# Patient Record
Sex: Female | Born: 1955
Health system: Southern US, Community
[De-identification: ages and names within clinical notes are randomized; demographics above are authoritative.]

## PROBLEM LIST (undated history)

## (undated) DIAGNOSIS — I499 Cardiac arrhythmia, unspecified: Secondary | ICD-10-CM

## (undated) DIAGNOSIS — I10 Essential (primary) hypertension: Secondary | ICD-10-CM

## (undated) HISTORY — PX: BREAST SURGERY: SHX581

## (undated) HISTORY — PX: KNEE SURGERY: SHX244

---

## 2005-06-05 ENCOUNTER — Ambulatory Visit (HOSPITAL_COMMUNITY): Admission: RE | Admit: 2005-06-05 | Discharge: 2005-06-05 | Payer: Self-pay | Admitting: Family Medicine

## 2005-06-05 ENCOUNTER — Emergency Department (HOSPITAL_COMMUNITY): Admission: EM | Admit: 2005-06-05 | Discharge: 2005-06-05 | Payer: Self-pay | Admitting: Family Medicine

## 2007-11-01 ENCOUNTER — Emergency Department (HOSPITAL_COMMUNITY): Admission: EM | Admit: 2007-11-01 | Discharge: 2007-11-01 | Payer: Self-pay | Admitting: Family Medicine

## 2009-02-05 ENCOUNTER — Emergency Department (HOSPITAL_COMMUNITY): Admission: EM | Admit: 2009-02-05 | Discharge: 2009-02-05 | Payer: Self-pay | Admitting: Family Medicine

## 2009-02-05 ENCOUNTER — Ambulatory Visit: Payer: Self-pay | Admitting: Vascular Surgery

## 2009-02-05 ENCOUNTER — Ambulatory Visit: Admission: RE | Admit: 2009-02-05 | Discharge: 2009-02-05 | Payer: Self-pay | Admitting: Internal Medicine

## 2009-02-05 ENCOUNTER — Encounter (INDEPENDENT_AMBULATORY_CARE_PROVIDER_SITE_OTHER): Payer: Self-pay | Admitting: Internal Medicine

## 2009-11-25 ENCOUNTER — Emergency Department (HOSPITAL_COMMUNITY): Admission: EM | Admit: 2009-11-25 | Discharge: 2009-11-25 | Payer: Self-pay | Admitting: Emergency Medicine

## 2010-06-15 ENCOUNTER — Emergency Department (HOSPITAL_COMMUNITY): Admission: EM | Admit: 2010-06-15 | Discharge: 2010-06-15 | Payer: Self-pay | Admitting: Emergency Medicine

## 2010-11-22 LAB — POCT CARDIAC MARKERS
CKMB, poc: 1.4 ng/mL (ref 1.0–8.0)
Myoglobin, poc: 114 ng/mL (ref 12–200)
Troponin i, poc: <0.05 ng/mL (ref 0.00–0.09)

## 2010-11-22 LAB — POCT I-STAT, CHEM 8
BUN: 6 mg/dL (ref 6–23)
Calcium, Ion: 1.04 mmol/L — ABNORMAL LOW (ref 1.12–1.32)
Chloride: 106 mEq/L (ref 96–112)
Creatinine, Ser: 0.6 mg/dL (ref 0.4–1.2)
Glucose, Bld: 98 mg/dL (ref 70–99)
HCT: 43 % (ref 36.0–46.0)
Hemoglobin: 14.6 g/dL (ref 12.0–15.0)
Potassium: 3.3 mEq/L — ABNORMAL LOW (ref 3.5–5.1)
Sodium: 138 mEq/L (ref 135–145)
TCO2: 22 mmol/L (ref 0–100)

## 2010-12-07 LAB — POCT I-STAT, CHEM 8
BUN: 10 mg/dL (ref 6–23)
Calcium, Ion: 1.14 mmol/L (ref 1.12–1.32)
Chloride: 104 mEq/L (ref 96–112)
Creatinine, Ser: 0.7 mg/dL (ref 0.4–1.2)
Glucose, Bld: 89 mg/dL (ref 70–99)
HCT: 44 % (ref 36.0–46.0)
Hemoglobin: 15 g/dL (ref 12.0–15.0)
Potassium: 4.2 mEq/L (ref 3.5–5.1)
Sodium: 140 mEq/L (ref 135–145)
TCO2: 28 mmol/L (ref 0–100)

## 2011-05-24 LAB — I-STAT 8, (EC8 V) (CONVERTED LAB)
Acid-base deficit: 3 — ABNORMAL HIGH
BUN: 7
Bicarbonate: 23.7
Chloride: 107
Glucose, Bld: 106 — ABNORMAL HIGH
HCT: 47 — ABNORMAL HIGH
Hemoglobin: 16 — ABNORMAL HIGH
Operator id: 146091
Potassium: 3.8
Sodium: 137
TCO2: 25
pCO2, Ven: 48.3
pH, Ven: 7.299

## 2011-05-24 LAB — POCT CARDIAC MARKERS
CKMB, poc: 1.3
CKMB, poc: 1.4
CKMB, poc: <1 — ABNORMAL LOW
Myoglobin, poc: 53.4
Myoglobin, poc: 54.9
Myoglobin, poc: 55.1
Operator id: 146091
Operator id: 146091
Operator id: 146091
Troponin i, poc: <0.05
Troponin i, poc: <0.05
Troponin i, poc: <0.05

## 2011-05-24 LAB — URINE CULTURE: Colony Count: 7000

## 2011-05-24 LAB — DIFFERENTIAL
Basophils Absolute: 0.1
Basophils Relative: 1
Eosinophils Absolute: 0.2
Eosinophils Relative: 2
Lymphocytes Relative: 24
Lymphs Abs: 2.9
Monocytes Absolute: 0.3
Monocytes Relative: 2 — ABNORMAL LOW
Neutro Abs: 8.7 — ABNORMAL HIGH
Neutrophils Relative %: 71

## 2011-05-24 LAB — URINE MICROSCOPIC-ADD ON

## 2011-05-24 LAB — URINALYSIS, ROUTINE W REFLEX MICROSCOPIC
Bilirubin Urine: NEGATIVE
Glucose, UA: NEGATIVE
Ketones, ur: NEGATIVE
Leukocytes, UA: NEGATIVE
Nitrite: NEGATIVE
Protein, ur: NEGATIVE
Specific Gravity, Urine: 1.01
Urobilinogen, UA: 1
pH: 6.5

## 2011-05-24 LAB — POCT I-STAT CREATININE
Creatinine, Ser: 0.7
Operator id: 146091

## 2011-05-24 LAB — CBC
HCT: 41.8
Hemoglobin: 13.6
MCHC: 32.6
MCV: 81.2
Platelets: 374
RBC: 5.15 — ABNORMAL HIGH
RDW: 15.2
WBC: 12.2 — ABNORMAL HIGH

## 2011-07-30 ENCOUNTER — Emergency Department (INDEPENDENT_AMBULATORY_CARE_PROVIDER_SITE_OTHER)
Admission: EM | Admit: 2011-07-30 | Discharge: 2011-07-30 | Disposition: A | Discharge status: Home / Self Care | Payer: 59 | Source: Home / Self Care | Attending: Family Medicine | Admitting: Family Medicine

## 2011-07-30 ENCOUNTER — Ambulatory Visit (HOSPITAL_COMMUNITY)
Admit: 2011-07-30 | Discharge: 2011-07-30 | Disposition: A | Discharge status: Home / Self Care | Payer: 59 | Attending: Family Medicine | Admitting: Family Medicine

## 2011-07-30 DIAGNOSIS — M79609 Pain in unspecified limb: Secondary | ICD-10-CM

## 2011-07-30 DIAGNOSIS — M7989 Other specified soft tissue disorders: Secondary | ICD-10-CM

## 2011-07-30 DIAGNOSIS — M6790 Unspecified disorder of synovium and tendon, unspecified site: Secondary | ICD-10-CM

## 2011-07-30 HISTORY — DX: Cardiac arrhythmia, unspecified: I49.9

## 2011-07-30 HISTORY — DX: Essential (primary) hypertension: I10

## 2011-07-30 MED ORDER — HYDROCODONE-ACETAMINOPHEN 5-325 MG PO TABS
ORAL_TABLET | ORAL | Status: AC
Start: 2011-07-30 — End: 2011-08-09

## 2011-07-30 MED ORDER — PREDNISONE (PAK) 10 MG PO TABS: ORAL_TABLET | ORAL | Status: AC

## 2011-07-30 NOTE — ED Notes (Signed)
Area distal to rt antecubital feels swollen, firm and tender to palpation.  No increased warmth or redness noted.

## 2011-07-30 NOTE — Progress Notes (Signed)
Right upper extremity venous duplex completed at 11:05.  Preliminary report is negative for DVT or SVT. Smiley Houseman 07/30/2011, 12:08 PM

## 2011-07-30 NOTE — ED Notes (Signed)
Pt states she has hx of HTN but stopped medication long time ago because it made her head hurt.  States she has not slept well due to pain in rt arm and she also had stressful day yesterday.

## 2011-07-30 NOTE — ED Provider Notes (Signed)
History     CSN: [id number] Arrival date & time: 07/30/2011  9:27 AM   First MD Initiated Contact with Patient 07/30/11 1006      Chief Complaint  Patient presents with  . Arm Pain    (Consider location/radiation/quality/duration/timing/severity/associated sxs/prior treatment) HPI Comments: Sheila Webster presents for evaluation of 2 week hx of RIGHT upper extremity. She denies any injury. She reports hx of HTN, uncontrolled and has not seen a provider in an unknown amount of time. She also reports hx of tobacco abuse, and currently smokes. She reports constant pain, worsened with movement, or laying on that side.   Patient is a 55 y.o. female presenting with arm pain. The history is provided by the patient.  Arm Pain This is a new problem. The current episode started more than 1 week ago. The problem occurs constantly. The problem has not changed since onset.Pertinent negatives include no chest pain and no shortness of breath. The symptoms are aggravated by bending and exertion. The symptoms are relieved by nothing. She has tried a warm compress for the symptoms. The treatment provided no relief.    Past Medical History  Diagnosis Date  . Irregular heartbeat   . Hypertension     Past Surgical History  Procedure Date  . Breast surgery     No family history on file.  History  Substance Use Topics  . Smoking status: Current Everyday Smoker -- 0.5 packs/day  . Smokeless tobacco: Not on file  . Alcohol Use: No    OB History    Grav Para Term Preterm Abortions TAB SAB Ect Mult Living                  Review of Systems  Constitutional: Negative.   HENT: Negative.   Eyes: Negative.   Respiratory: Negative.  Negative for shortness of breath.   Cardiovascular: Negative for chest pain.  Gastrointestinal: Negative.   Genitourinary: Negative.   Musculoskeletal: Negative.   Skin: Negative.   Neurological: Negative.     Allergies  Review of patient's allergies indicates no  known allergies.  Home Medications   Current Outpatient Rx  Name Route Sig Dispense Refill  . TYLENOL PO Oral Take by mouth as needed.        BP 148/110  Pulse 84  Temp(Src) 98.3 F (36.8 C) (Oral)  Resp 16  SpO2 100%  Physical Exam  Nursing note and vitals reviewed. Constitutional: She is oriented to person, place, and time. She appears well-developed and well-nourished.  HENT:  Head: Normocephalic and atraumatic.  Eyes: EOM are normal.  Neck: Normal range of motion.  Musculoskeletal:       Right elbow: She exhibits swelling. She exhibits normal range of motion. tenderness found. No radial head, no medial epicondyle, no lateral epicondyle and no olecranon process tenderness noted.       Arms: Neurological: She is alert and oriented to person, place, and time.  Skin: Skin is warm and dry.    ED Course  Procedures (including critical care time)  Labs Reviewed - No data to display No results found.   No diagnosis found.    MDM  Venous doppler upper extremity: negative for clot or obstruction        Richardo Priest, MD 07/30/11 1243

## 2011-07-30 NOTE — ED Notes (Signed)
Pt to vascular lab for Venous doppler rt arm via [facility] shuttle.

## 2011-07-30 NOTE — ED Notes (Signed)
C/o pain and swelling to rt arm for 2 weeks.  States unable to sleep due to pain.  Denies injury or fall.

## 2011-07-30 NOTE — ED Notes (Signed)
Dr Juanetta Gosling notified of pt's BP and hx

## 2012-05-29 ENCOUNTER — Emergency Department (INDEPENDENT_AMBULATORY_CARE_PROVIDER_SITE_OTHER)
Admission: EM | Admit: 2012-05-29 | Discharge: 2012-05-29 | Disposition: A | Discharge status: Home / Self Care | Payer: Commercial Managed Care - PPO | Source: Home / Self Care | Attending: Family Medicine | Admitting: Family Medicine

## 2012-05-29 ENCOUNTER — Encounter (HOSPITAL_COMMUNITY): Payer: Self-pay | Admitting: *Deleted

## 2012-05-29 DIAGNOSIS — H5710 Ocular pain, unspecified eye: Secondary | ICD-10-CM

## 2012-05-29 DIAGNOSIS — S0502XA Injury of conjunctiva and corneal abrasion without foreign body, left eye, initial encounter: Secondary | ICD-10-CM

## 2012-05-29 MED ORDER — POLYMYXIN B-TRIMETHOPRIM 10000-0.1 UNIT/ML-% OP SOLN: 1.0000 [drp] | Freq: Four times a day (QID) | OPHTHALMIC | Status: DC

## 2012-05-29 MED ORDER — TETRACAINE HCL 0.5 % OP SOLN
OPHTHALMIC | Status: AC
  Filled 2012-05-29: qty 2

## 2012-05-29 MED ORDER — TETRACAINE HCL 0.5 % OP SOLN
1.0000 [drp] | Freq: Once | OPHTHALMIC | Status: AC
  Administered 2012-05-29: 1 [drp] via OPHTHALMIC

## 2012-05-29 NOTE — ED Provider Notes (Signed)
Medical screening examination/treatment/procedure(s) were performed by resident physician or non-physician practitioner and as supervising physician I was immediately available for consultation/collaboration.   Barkley Bruns MD.    Linna Hoff, MD 05/29/12 2123

## 2012-05-29 NOTE — ED Notes (Signed)
Pt reports she woke up this morning with redness, photosensitivity  and burning of the  left eye.  She denies any recent trauma

## 2012-05-29 NOTE — ED Provider Notes (Signed)
History     CSN: [id number]  Arrival date & time 05/29/12  1842   First MD Initiated Contact with Patient 05/29/12 2029      Chief Complaint  Patient presents with  . Eye Pain    (Consider location/radiation/quality/duration/timing/severity/associated sxs/prior treatment) Patient is a 56 y.o. female presenting with eye pain. The history is provided by the patient.  Eye Pain  Patient reports left eye pain for one day associated with redness and photophobia.  Denies known foreign body.  Denies previous history of same.  Not diabetic, no previous eye surgery/trauma, denies cataracts or glaucoma. No floaters or flashing lights No vision loss + blurred vision + eye pain No eyelid itching No tearing No headache/scalp tenderness Does not wear contacts but wears glasses. Works at [employer], has made an appointment with Dr. Mitzi Davenport for eye evaluation  Past Medical History  Diagnosis Date  . Irregular heartbeat   . Hypertension     Past Surgical History  Procedure Date  . Breast surgery     Family History  Problem Relation Age of Onset  . Cancer Mother     History  Substance Use Topics  . Smoking status: Current Every Day Smoker -- 0.5 packs/day  . Smokeless tobacco: Not on file  . Alcohol Use: No    OB History    Grav Para Term Preterm Abortions TAB SAB Ect Mult Living                  Review of Systems  Constitutional: Negative.   Eyes: Positive for photophobia, pain, discharge, redness and visual disturbance. Negative for itching.  Respiratory: Negative.   Neurological: Negative.     Allergies  Review of patient's allergies indicates no known allergies.  Home Medications   Current Outpatient Rx  Name Route Sig Dispense Refill  . TYLENOL PO Oral Take by mouth as needed.      Marland Kitchen POLYMYXIN B-TRIMETHOPRIM 10000-0.1 UNIT/ML-% OP SOLN Left Eye Place 1 drop into the left eye 4 (four) times daily. For five days 10 mL 0    BP 122/86  Pulse 94  Temp  98.5 F (36.9 C) (Oral)  Resp 18  SpO2 100%  Physical Exam  Nursing note and vitals reviewed. Constitutional: She is oriented to person, place, and time. Vital signs are normal. She appears well-developed and well-nourished. She is active and cooperative.  HENT:  Head: Normocephalic and atraumatic.  Right Ear: Hearing, tympanic membrane, external ear and ear canal normal.  Left Ear: Hearing, tympanic membrane, external ear and ear canal normal.  Nose: Nose normal.  Mouth/Throat: Uvula is midline, oropharynx is clear and moist and mucous membranes are normal.  Eyes: EOM and lids are normal. Pupils are equal, round, and reactive to light. No foreign bodies found. Right eye exhibits no chemosis, no discharge, no exudate and no hordeolum. No foreign body present in the right eye. Left eye exhibits no discharge, no exudate and no hordeolum. No foreign body present in the left eye. Right conjunctiva is not injected. Right conjunctiva has no hemorrhage. Left conjunctiva is injected. Left conjunctiva has no hemorrhage. No scleral icterus.         Tetracaine and fluoroscien applied to left eye, examined with woods lamp.  Left corneal abrasion noted at 2 oclock, no foreign body found.  Pt tolerated procedure well  Neck: Trachea normal. Neck supple.  Cardiovascular: Normal rate and regular rhythm.   Pulmonary/Chest: Effort normal and breath sounds normal.  Neurological: She is alert  and oriented to person, place, and time. No cranial nerve deficit or sensory deficit.  Skin: Skin is warm and dry.  Psychiatric: She has a normal mood and affect. Her speech is normal and behavior is normal. Judgment and thought content normal. Cognition and memory are normal.    ED Course  Procedures (including critical care time)  Labs Reviewed - No data to display No results found.   1. Left corneal abrasion   2. Eye pain       MDM  Antibiotics as prescribed.  Ibuprofen as needed for pain.  Seek follow-up  care with an ophthalmologist within 2-3 days to ensure resolution.        Johnsie Kindred, NP 05/29/12 2053

## 2014-12-24 ENCOUNTER — Encounter (HOSPITAL_COMMUNITY): Payer: Self-pay

## 2014-12-24 ENCOUNTER — Emergency Department (HOSPITAL_COMMUNITY): Payer: Commercial Managed Care - PPO

## 2014-12-24 ENCOUNTER — Emergency Department (HOSPITAL_COMMUNITY)
Admission: EM | Admit: 2014-12-24 | Discharge: 2014-12-24 | Disposition: A | Discharge status: Home / Self Care | Payer: Commercial Managed Care - PPO | Attending: Emergency Medicine | Admitting: Emergency Medicine

## 2014-12-24 DIAGNOSIS — M1712 Unilateral primary osteoarthritis, left knee: Secondary | ICD-10-CM | POA: Insufficient documentation

## 2014-12-24 DIAGNOSIS — R609 Edema, unspecified: Secondary | ICD-10-CM

## 2014-12-24 DIAGNOSIS — Z791 Long term (current) use of non-steroidal anti-inflammatories (NSAID): Secondary | ICD-10-CM | POA: Insufficient documentation

## 2014-12-24 DIAGNOSIS — M25462 Effusion, left knee: Secondary | ICD-10-CM

## 2014-12-24 DIAGNOSIS — Z720 Tobacco use: Secondary | ICD-10-CM | POA: Insufficient documentation

## 2014-12-24 DIAGNOSIS — I10 Essential (primary) hypertension: Secondary | ICD-10-CM | POA: Insufficient documentation

## 2014-12-24 DIAGNOSIS — Z9889 Other specified postprocedural states: Secondary | ICD-10-CM | POA: Insufficient documentation

## 2014-12-24 MED ORDER — IBUPROFEN 800 MG PO TABS: 800.0000 mg | ORAL_TABLET | Freq: Three times a day (TID) | ORAL | Status: DC

## 2014-12-24 NOTE — ED Provider Notes (Signed)
CSN: [id number]     Arrival date & time 12/24/14  1711 History  This chart was scribed for non-physician practitioner, Sheila Skeenobyn Kaled Allende, PA-C, working with Mancel BaleElliott Wentz, MD by Charline BillsEssence Howell, ED Scribe. This patient was seen in room WTR6/WTR6 and the patient's care was started at 5:38 PM.   Chief Complaint  Patient presents with  . Knee Pain   The history is provided by the patient. No language interpreter was used.   HPI Comments: Sheila Webster is a 59 y.o. female, with a h/o HTN, who presents to the Emergency Department complaining of persistent L knee pain for the past 2 weeks. Pt first noticed the pain while walking. She denies recent injury or previous injury. She reports 8/10 pain that is exacerbated with walking. Pt also reports associate swelling and bruising to the area. She has been treating with 2 Aleve tablets and 800 mg ibuprofen with temporary relief.  Past Medical History  Diagnosis Date  . Irregular heartbeat   . Hypertension    Past Surgical History  Procedure Laterality Date  . Breast surgery    . Knee surgery Right    Family History  Problem Relation Age of Onset  . Cancer Mother    History  Substance Use Topics  . Smoking status: Current Every Day Smoker -- 0.50 packs/day    Types: Cigarettes  . Smokeless tobacco: Not on file  . Alcohol Use: No   OB History    No data available     Review of Systems  Constitutional: Negative.   Respiratory: Negative.   Cardiovascular: Negative.   Musculoskeletal: Positive for joint swelling and arthralgias.  Neurological: Negative.    Allergies  Review of patient's allergies indicates no known allergies.  Home Medications   Prior to Admission medications   Medication Sig Start Date End Date Taking? Authorizing Provider  ibuprofen (ADVIL,MOTRIN) 200 MG tablet Take 200-800 mg by mouth every 6 (six) hours as needed for moderate pain.   Yes Historical Provider, MD  naproxen sodium (ANAPROX) 220 MG tablet Take 220 mg by  mouth 2 (two) times daily with a meal.   Yes Historical Provider, MD  ibuprofen (ADVIL,MOTRIN) 800 MG tablet Take 1 tablet (800 mg total) by mouth 3 (three) times daily. 12/24/14   Kathrynn Speedobyn M Dinero Chavira, PA-C  trimethoprim-polymyxin b (POLYTRIM) ophthalmic solution Place 1 drop into the left eye 4 (four) times daily. For five days Patient not taking: Reported on 12/24/2014 05/29/12   Johnsie Kindredarmen L Chatten, NP   BP 132/85 mmHg  Pulse 90  Temp(Src) 99.3 F (37.4 C) (Oral)  Resp 16  SpO2 99% Physical Exam  Constitutional: She is oriented to person, place, and time. She appears well-developed and well-nourished. No distress.  HENT:  Head: Normocephalic and atraumatic.  Mouth/Throat: Oropharynx is clear and moist.  Eyes: Conjunctivae and EOM are normal.  Neck: Normal range of motion. Neck supple.  Cardiovascular: Normal rate, regular rhythm and normal heart sounds.   Pulmonary/Chest: Effort normal and breath sounds normal. No respiratory distress.  Musculoskeletal: Normal range of motion.  Left knee TTP medial to patella and over medial jointline with mild swelling. No erythema or warmth. Full range of motion. No ligamentous laxity. Crepitus noted.  Neurological: She is alert and oriented to person, place, and time. No sensory deficit.  Skin: Skin is warm and dry.  Psychiatric: She has a normal mood and affect. Her behavior is normal.  Nursing note and vitals reviewed.  ED Course  Procedures (including critical  care time) DIAGNOSTIC STUDIES: Oxygen Saturation is 99% on RA, normal by my interpretation.    COORDINATION OF CARE: 5:40 PM-Discussed treatment plan which includes XR with pt at bedside and pt agreed to plan.   Labs Review Labs Reviewed - No data to display  Imaging Review Dg Knee Complete 4 Views Left  12/24/2014   CLINICAL DATA:  Medial left knee pain  EXAM: LEFT KNEE - COMPLETE 4+ VIEW  COMPARISON:  None.  FINDINGS: Small suprapatellar joint effusion. Patellofemoral, medial compartment  narrowing is noted. There is tricompartment marginal spur formation in sharpening of the tibial spines. No fracture or dislocation. There is no evidence of arthropathy or other focal bone abnormality. Soft tissues are unremarkable.  IMPRESSION: 1. Small joint effusion. 2. Tricompartment osteoarthritis.   Electronically Signed   By: Signa Kell M.D.   On: 12/24/2014 19:02     EKG Interpretation None      MDM   Final diagnoses:  Osteoarthritis of left knee, unspecified osteoarthritis type  Knee effusion, left   NAD. VSS. No erythema or warmth concerning for infection or gout. X-ray with results as shown above. Advised continued NSAIDs, RICE. Resources given for PCP establishment, along with orthopedic follow-up. Stable for discharge. Return precautions given. Patient states understanding of treatment care plan and is agreeable.  I personally performed the services described in this documentation, which was scribed in my presence. The recorded information has been reviewed and is accurate.  Kathrynn Speed, PA-C 12/24/14 1921  Mancel Bale, MD 12/26/14 [phone]

## 2014-12-24 NOTE — ED Notes (Signed)
Pt c/o increasing L knee pain x 1 week and swelling starting this morning. Pain score 8/10.  Denies injury.  Sts "it kinda locked up when I was trying to get out of bed this morning."  Pt reports taking OTC pain medication w/ a little relief.  Pt was able to ambulate into Triage room w/o assistance.

## 2014-12-24 NOTE — Discharge Instructions (Signed)
Take ibuprofen as directed for pain and swelling. Rest, ice and elevate your knee.  Osteoarthritis Osteoarthritis is a disease that causes soreness and inflammation of a joint. It occurs when the cartilage at the affected joint wears down. Cartilage acts as a cushion, covering the ends of bones where they meet to form a joint. Osteoarthritis is the most common form of arthritis. It often occurs in older people. The joints affected most often by this condition include those in the:  Ends of the fingers.  Thumbs.  Neck.  Lower back.  Knees.  Hips. CAUSES  Over time, the cartilage that covers the ends of bones begins to wear away. This causes bone to rub on bone, producing pain and stiffness in the affected joints.  RISK FACTORS Certain factors can increase your chances of having osteoarthritis, including:  Older age.  Excessive body weight.  Overuse of joints.  Previous joint injury. SIGNS AND SYMPTOMS   Pain, swelling, and stiffness in the joint.  Over time, the joint may lose its normal shape.  Small deposits of bone (osteophytes) may grow on the edges of the joint.  Bits of bone or cartilage can break off and float inside the joint space. This may cause more pain and damage. DIAGNOSIS  Your health care provider will do a physical exam and ask about your symptoms. Various tests may be ordered, such as:  X-rays of the affected joint.  An MRI scan.  Blood tests to rule out other types of arthritis.  Joint fluid tests. This involves using a needle to draw fluid from the joint and examining the fluid under a microscope. TREATMENT  Goals of treatment are to control pain and improve joint function. Treatment plans may include:  A prescribed exercise program that allows for rest and joint relief.  A weight control plan.  Pain relief techniques, such as:  Properly applied heat and cold.  Electric pulses delivered to nerve endings under the skin (transcutaneous  electrical nerve stimulation [TENS]).  Massage.  Certain nutritional supplements.  Medicines to control pain, such as:  Acetaminophen.  Nonsteroidal anti-inflammatory drugs (NSAIDs), such as naproxen.  Narcotic or central-acting agents, such as tramadol.  Corticosteroids. These can be given orally or as an injection.  Surgery to reposition the bones and relieve pain (osteotomy) or to remove loose pieces of bone and cartilage. Joint replacement may be needed in advanced states of osteoarthritis. HOME CARE INSTRUCTIONS   Take medicines only as directed by your health care provider.  Maintain a healthy weight. Follow your health care provider's instructions for weight control. This may include dietary instructions.  Exercise as directed. Your health care provider can recommend specific types of exercise. These may include:  Strengthening exercises. These are done to strengthen the muscles that support joints affected by arthritis. They can be performed with weights or with exercise bands to add resistance.  Aerobic activities. These are exercises, such as brisk walking or low-impact aerobics, that get your heart pumping.  Range-of-motion activities. These keep your joints limber.  Balance and agility exercises. These help you maintain daily living skills.  Rest your affected joints as directed by your health care provider.  Keep all follow-up visits as directed by your health care provider. SEEK MEDICAL CARE IF:   Your skin turns red.  You develop a rash in addition to your joint pain.  You have worsening joint pain.  You have a fever along with joint or muscle aches. SEEK IMMEDIATE MEDICAL CARE IF:  You  have a significant loss of weight or appetite.  You have night sweats. FOR MORE INFORMATION   National Institute of Arthritis and Musculoskeletal and Skin Diseases: www.niams.http://www.myers.net/  [employer] on Aging: https://walker.com/  American College of Rheumatology:  www.rheumatology.org Document Released: 08/16/2005 Document Revised: 12/31/2013 Document Reviewed: 04/23/2013 [facility] Patient Information 2015 [place], [place]. This information is not intended to replace advice given to you by your health care provider. Make sure you discuss any questions you have with your health care provider.  Knee Effusion The medical term for having fluid in your knee is effusion. This is often due to an internal derangement of the knee. This means something is wrong inside the knee. Some of the causes of fluid in the knee may be torn cartilage, a torn ligament, or bleeding into the joint from an injury. Your knee is likely more difficult to bend and move. This is often because there is increased pain and pressure in the joint. The time it takes for recovery from a knee effusion depends on different factors, including:   Type of injury.  Your age.  Physical and medical conditions.  Rehabilitation Strategies. How long you will be away from your normal activities will depend on what kind of knee problem you have and how much damage is present. Your knee has two types of cartilage. Articular cartilage covers the bone ends and lets your knee bend and move smoothly. Two menisci, thick pads of cartilage that form a rim inside the joint, help absorb shock and stabilize your knee. Ligaments bind the bones together and support your knee joint. Muscles move the joint, help support your knee, and take stress off the joint itself. CAUSES  Often an effusion in the knee is caused by an injury to one of the menisci. This is often a tear in the cartilage. Recovery after a meniscus injury depends on how much meniscus is damaged and whether you have damaged other knee tissue. Small tears may heal on their own with conservative treatment. Conservative means rest, limited weight bearing activity and muscle strengthening exercises. Your recovery may take up to 6 weeks.  TREATMENT  Larger tears  may require surgery. Meniscus injuries may be treated during arthroscopy. Arthroscopy is a procedure in which your surgeon uses a small telescope like instrument to look in your knee. Your caregiver can make a more accurate diagnosis (learning what is wrong) by performing an arthroscopic procedure. If your injury is on the inner margin of the meniscus, your surgeon may trim the meniscus back to a smooth rim. In other cases your surgeon will try to repair a damaged meniscus with stitches (sutures). This may make rehabilitation take longer, but may provide better long term result by helping your knee keep its shock absorption capabilities. Ligaments which are completely torn usually require surgery for repair. HOME CARE INSTRUCTIONS  Use crutches as instructed.  If a brace is applied, use as directed.  Once you are home, an ice pack applied to your swollen knee may help with discomfort and help decrease swelling.  Keep your knee raised (elevated) when you are not up and around or on crutches.  Only take over-the-counter or prescription medicines for pain, discomfort, or fever as directed by your caregiver.  Your caregivers will help with instructions for rehabilitation of your knee. This often includes strengthening exercises.  You may resume a normal diet and activities as directed. SEEK MEDICAL CARE IF:   There is increased swelling in your knee.  You notice redness,  swelling, or increasing pain in your knee.  An unexplained oral temperature above 102 F (38.9 C) develops. SEEK IMMEDIATE MEDICAL CARE IF:   You develop a rash.  You have difficulty breathing.  You have any allergic reactions from medications you may have been given.  There is severe pain with any motion of the knee. MAKE SURE YOU:   Understand these instructions.  Will watch your condition.  Will get help right away if you are not doing well or get worse. Document Released: 11/06/2003 Document Revised:  11/08/2011 Document Reviewed: 01/10/2008 [facility] Patient Information 2015 [place]. This information is not intended to replace advice given to you by your health care provider. Make sure you discuss any questions you have with your health care provider.

## 2017-06-07 ENCOUNTER — Ambulatory Visit (HOSPITAL_COMMUNITY)
Admission: EM | Admit: 2017-06-07 | Discharge: 2017-06-07 | Disposition: A | Discharge status: Home / Self Care | Payer: Commercial Managed Care - PPO | Attending: Physician Assistant | Admitting: Physician Assistant

## 2017-06-07 ENCOUNTER — Encounter (HOSPITAL_COMMUNITY): Payer: Self-pay | Admitting: Emergency Medicine

## 2017-06-07 DIAGNOSIS — H10232 Serous conjunctivitis, except viral, left eye: Secondary | ICD-10-CM | POA: Diagnosis not present

## 2017-06-07 MED ORDER — ERYTHROMYCIN 5 MG/GM OP OINT: TOPICAL_OINTMENT | OPHTHALMIC | 0 refills | Status: DC

## 2017-06-07 MED ORDER — ERYTHROMYCIN 5 MG/GM OP OINT
TOPICAL_OINTMENT | Freq: Once | OPHTHALMIC | Status: AC
  Administered 2017-06-07: 1 via OPHTHALMIC

## 2017-06-07 MED ORDER — ERYTHROMYCIN 5 MG/GM OP OINT
TOPICAL_OINTMENT | OPHTHALMIC | Status: AC
  Filled 2017-06-07: qty 7

## 2017-06-07 NOTE — ED Notes (Signed)
Visual acuity performed with pt glasses on

## 2017-06-07 NOTE — ED Provider Notes (Signed)
06/07/2017 6:37 PM   DOB: 09-10-1955 / MRN: [id number]  SUBJECTIVE:  Sheila Webster is a 61 y.o. female presenting for sharp left eye pain that started yesterday and seems to be getting worse.  The problem has been on and off for three weeks. Ibuprofen does help.   Denies vision changes.  No sick contacts. Tells me that she has excessive crusting of the eye.    She has No Known Allergies.   She  has a past medical history of Hypertension and Irregular heartbeat.    She  reports that she has been smoking Cigarettes.  She has been smoking about 0.50 packs per day. She does not have any smokeless tobacco history on file. She reports that she does not drink alcohol or use drugs. She  reports that she currently engages in sexual activity. She reports using the following method of birth control/protection: Post-menopausal. The patient  has a past surgical history that includes Breast surgery and Knee surgery (Right).  Her family history includes Cancer in her mother.  Review of Systems  Constitutional: Negative for chills, diaphoresis and fever.  Eyes: Negative.   Gastrointestinal: Negative for nausea.  Skin: Negative for rash.  Neurological: Negative for dizziness, sensory change, speech change, focal weakness and headaches.    OBJECTIVE:  BP (!) 156/99 (BP Location: Right Arm)   Pulse (!) 50   Temp 98.3 F (36.8 C) (Oral)   Resp 18   SpO2 100%   Physical Exam  Constitutional: She is oriented to person, place, and time. She is active.  Non-toxic appearance.  HENT:  Right Ear: Hearing, tympanic membrane, external ear and ear canal normal.  Left Ear: Hearing, tympanic membrane, external ear and ear canal normal.  Nose: Nose normal. Right sinus exhibits no maxillary sinus tenderness and no frontal sinus tenderness. Left sinus exhibits no maxillary sinus tenderness and no frontal sinus tenderness.  Mouth/Throat: Uvula is midline, oropharynx is clear and moist and mucous membranes are normal.  Mucous membranes are not dry. No oropharyngeal exudate, posterior oropharyngeal edema or tonsillar abscesses.  Eyes: Left eye exhibits no chemosis, no discharge and no exudate. No foreign body present in the left eye. Left conjunctiva is injected. Left conjunctiva has no hemorrhage. Left eye exhibits normal extraocular motion and no nystagmus. Left pupil is round and reactive. Pupils are equal.  No photophobia. Visula fields intact by challenge.   Cardiovascular: Normal rate, regular rhythm, S1 normal, S2 normal, normal heart sounds and intact distal pulses.  Exam reveals no gallop, no friction rub and no decreased pulses.   No murmur heard. Pulmonary/Chest: Effort normal. No tachypnea. She has no rales.  Abdominal: She exhibits no distension.  Musculoskeletal: She exhibits no edema.  Lymphadenopathy:       Head (right side): No submandibular and no tonsillar adenopathy present.       Head (left side): No submandibular and no tonsillar adenopathy present.    She has no cervical adenopathy.  Neurological: She is alert and oriented to person, place, and time.  Skin: Skin is warm and dry. She is not diaphoretic. No pallor.    No results found for this or any previous visit (from the past 72 hour(s)).  No results found.  ASSESSMENT AND PLAN:  The encounter diagnosis was Serous conjunctivitis of left eye. RTC precautions discussed.  Ointment here and called into the pharmacy.     The patient is advised to call or return to clinic if she does not see an improvement  in symptoms, or to seek the care of the closest emergency department if she worsens with the above planDeliah Bostonl Clark, MHS, PA-C 06/07/2017 6:37 PM    Ofilia Neas, PA-C 06/07/17 1839

## 2017-06-07 NOTE — Discharge Instructions (Signed)
If you have any sudden changes in vision go to the ED.

## 2017-06-07 NOTE — ED Triage Notes (Signed)
Pt here with intermittent left eye irritation x 3 weeks worse today

## 2018-03-22 ENCOUNTER — Encounter (HOSPITAL_COMMUNITY): Payer: Self-pay | Admitting: *Deleted

## 2018-03-22 ENCOUNTER — Other Ambulatory Visit: Payer: Self-pay

## 2018-03-22 ENCOUNTER — Emergency Department (HOSPITAL_COMMUNITY)
Admission: EM | Admit: 2018-03-22 | Discharge: 2018-03-22 | Disposition: A | Discharge status: Home / Self Care | Payer: Commercial Managed Care - PPO | Attending: Emergency Medicine | Admitting: Emergency Medicine

## 2018-03-22 DIAGNOSIS — I10 Essential (primary) hypertension: Secondary | ICD-10-CM | POA: Insufficient documentation

## 2018-03-22 DIAGNOSIS — F17210 Nicotine dependence, cigarettes, uncomplicated: Secondary | ICD-10-CM | POA: Insufficient documentation

## 2018-03-22 DIAGNOSIS — M62838 Other muscle spasm: Secondary | ICD-10-CM

## 2018-03-22 MED ORDER — METHOCARBAMOL 500 MG PO TABS: 1000.0000 mg | ORAL_TABLET | Freq: Four times a day (QID) | ORAL | 0 refills | Status: DC

## 2018-03-22 NOTE — Discharge Instructions (Signed)
Please read and follow all provided instructions.  Your diagnoses today include:  1. Muscle spasm of right lower extremity   2. Essential hypertension     Tests performed today include:  Vital signs. See below for your results today.   Medications prescribed:   Robaxin (methocarbamol) - muscle relaxer medication  DO NOT drive or perform any activities that require you to be awake and alert because this medicine can make you drowsy.   Take any prescribed medications only as directed.  Home care instructions:   Follow any educational materials contained in this packet  Follow-up instructions: Please follow-up with your primary care provider if you continue to have significant pain in 1 week.   Return instructions:   Please return if your toes or feet are numb or tingling, appear gray or blue, or you have severe pain (also elevate the leg and loosen splint or wrap if you were given one)  Please return to the Emergency Department if you experience worsening symptoms.   Please return if you have any other emergent concerns.  Additional Information:  Your vital signs today were: BP (!) 179/102    Pulse 98    Temp (!) 97.3 F (36.3 C) (Oral)    Resp 16    SpO2 99%  If your blood pressure (BP) was elevated above 135/85 this visit, please have this repeated by your doctor within one month. --------------

## 2018-03-22 NOTE — ED Provider Notes (Signed)
[facility]-EMERGENCY DEPT Provider Note   CSN: [id number] Arrival date & time: 03/22/18  0701     History   Chief Complaint Chief Complaint  Patient presents with  . Leg Pain    HPI Sheila Webster is a 62 y.o. female.  Patient presents the emergency department with intermittent right leg pains ongoing over the past several days but worse over the past day.  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  She denies acute injury to the area.  Yesterday she was having difficulty standing due to her symptoms and had to leave work early.  At time of exam, patient is not in any pain.  Pain is a sharp spasm-like pain in the upper right leg.  Nothing seems to make it better or worse.  Patient has had pain like this in the past relieved with Tylenol however this does not seem to be helping recently.  She denies any history of blood clots.  No chest pain or shortness of breath.  She is eating and drinking well without nausea, vomiting, or diarrhea.  She denies any urinary symptoms or back pain.  Patient was concerned that it could be a blood clot prompting emergency department visit this morning.  Onset of symptoms acute.  Course is intermittent, currently resolved.  Nothing makes symptoms better or worse.  Elevated blood pressure noted this morning.  Patient states that she has been on blood pressure medications in the past but discontinued it because they made her feel sick.  She is not on any current blood pressure medications or diuretics.     Past Medical History:  Diagnosis Date  . Hypertension   . Irregular heartbeat     There are no active problems to display for this patient.   Past Surgical History:  Procedure Laterality Date  . BREAST SURGERY    . KNEE SURGERY Right      OB History   None      Home Medications    Prior to Admission medications   Medication Sig Start Date End Date Taking? Authorizing  Provider  erythromycin ophthalmic ointment Place a 1/2 inch ribbon of ointment into the lower eyelid. 06/07/17   Ofilia Neaslark, Michael L, PA-C  ibuprofen (ADVIL,MOTRIN) 200 MG tablet Take 200-800 mg by mouth every 6 (six) hours as needed for moderate pain.    [provider]  ibuprofen (ADVIL,MOTRIN) 800 MG tablet Take 1 tablet (800 mg total) by mouth 3 (three) times daily. 12/24/14   Hess, Nada Boozerobyn M, PA-C  methocarbamol (ROBAXIN) 500 MG tablet Take 2 tablets (1,000 mg total) by mouth 4 (four) times daily. 03/22/18   Renne CriglerGeiple, Eh Sauseda, PA-C  naproxen sodium (ANAPROX) 220 MG tablet Take 220 mg by mouth 2 (two) times daily with a meal.    [provider]  trimethoprim-polymyxin b (POLYTRIM) ophthalmic solution Place 1 drop into the left eye 4 (four) times daily. For five days Patient not taking: Reported on 12/24/2014 05/29/12   Johnsie Kindredhatten, Carmen L, NP    Family History Family History  Problem Relation Age of Onset  . Cancer Mother     Social History Social History   Tobacco Use  . Smoking status: Current Every Day Smoker    Packs/day: 0.50    Types: Cigarettes  Substance Use Topics  . Alcohol use: No  . Drug use: No     Allergies   Patient has no known allergies.   Review  of Systems Review of Systems  Constitutional: Positive for activity change.  Gastrointestinal: Negative for abdominal pain, diarrhea, nausea and vomiting.  Genitourinary: Negative for frequency, hematuria and urgency.  Musculoskeletal: Positive for gait problem and myalgias. Negative for arthralgias, back pain, joint swelling and neck pain.  Skin: Negative for wound.  Neurological: Negative for weakness and numbness.     Physical Exam Updated Vital Signs BP (!) 179/102   Pulse 98   Temp (!) 97.3 F (36.3 C) (Oral)   Resp 16   SpO2 99%   Physical Exam  Constitutional: She appears well-developed and well-nourished.  HENT:  Head: Normocephalic and atraumatic.  Eyes: Pupils are equal, round, and  reactive to light.  Neck: Normal range of motion. Neck supple.  Cardiovascular: Normal pulses. Exam reveals no decreased pulses.  Pulses:      Dorsalis pedis pulses are 2+ on the right side, and 2+ on the left side.  Normal pedal pulses.  Musculoskeletal: She exhibits no edema or tenderness.  Patient with essentially normal lower extremity exam at time of exam.  No tenderness, swelling, or acute muscle spasm.  Patient denies being in any pain.  Neurological: She is alert. No sensory deficit.  Motor, sensation, and vascular distal to the affected area is fully intact.   Skin: Skin is warm and dry.  Psychiatric: She has a normal mood and affect.  Nursing note and vitals reviewed.    ED Treatments / Results  Labs (all labs ordered are listed, but only abnormal results are displayed) Labs Reviewed - No data to display  EKG None  Radiology No results found.  Procedures Procedures (including critical care time)  Medications Ordered in ED Medications - No data to display   Initial Impression / Assessment and Plan / ED Course  I have reviewed the triage vital signs and the nursing notes.  Pertinent labs & imaging results that were available during my care of the patient were reviewed by me and considered in my medical decision making (see chart for details).     Patient seen and examined.  We discussed different etiologies of her pain.  Discussed that I have low suspicion for this being a blood clot as her symptoms are not persistent and that she describes a pattern that is more consistent with muscle spasm.  Also, she does not have any swelling.  I offered ultrasound for peace of mind and patient declines.  Vital signs reviewed and are as follows: BP (!) 179/102   Pulse 98   Temp (!) 97.3 F (36.3 C) (Oral)   Resp 16   SpO2 99%   We will treat with prescription for Robaxin.  Also discussed topical medications and warm compresses to the area.  Will give a work note through  tomorrow.  Encouraged to rest, good hydration and oral intake.  Patient verbalizes understanding agrees with plan.  Encourage PCP follow-up for blood pressure management.  Encouraged return with worsening or persistent pain, development of swelling or redness in the leg.    Final Clinical Impressions(s) / ED Diagnoses   Final diagnoses:  Muscle spasm of right lower extremity  Essential hypertension   Patient with intermittent right thigh spasm type of pain.  Currently asymptomatic.  No clinical signs or history suspicious for DVT.  No signs of cellulitis or infection.  Patient has a normal neuro exam.  No back pain or abdominal pain.  Will treat symptomatically.  ED Discharge Orders        Ordered  methocarbamol (ROBAXIN) 500 MG tablet  4 times daily     03/22/18 0729       Renne Crigler, PA-C 03/22/18 4098    Terrilee Files, MD 03/23/18 Windy Fast

## 2018-03-22 NOTE — ED Triage Notes (Signed)
Pt reports she has been having some pain in her right leg for about a week. The pain starts in her upper leg and radiates down. She says that the pain started getting worse yesterday. Denies injury.

## 2019-02-17 ENCOUNTER — Observation Stay (HOSPITAL_COMMUNITY): Payer: Self-pay

## 2019-02-17 ENCOUNTER — Emergency Department (HOSPITAL_COMMUNITY): Payer: Self-pay

## 2019-02-17 ENCOUNTER — Inpatient Hospital Stay (HOSPITAL_COMMUNITY)
Admission: EM | Admit: 2019-02-17 | Discharge: 2019-02-19 | DRG: 066 | Disposition: A | Discharge status: Home / Self Care | Payer: Self-pay | Attending: Family Medicine | Admitting: Family Medicine

## 2019-02-17 ENCOUNTER — Other Ambulatory Visit: Payer: Self-pay

## 2019-02-17 ENCOUNTER — Encounter (HOSPITAL_COMMUNITY): Payer: Self-pay | Admitting: Emergency Medicine

## 2019-02-17 DIAGNOSIS — I639 Cerebral infarction, unspecified: Principal | ICD-10-CM | POA: Diagnosis present

## 2019-02-17 DIAGNOSIS — Z6836 Body mass index (BMI) 36.0-36.9, adult: Secondary | ICD-10-CM

## 2019-02-17 DIAGNOSIS — F17210 Nicotine dependence, cigarettes, uncomplicated: Secondary | ICD-10-CM | POA: Diagnosis present

## 2019-02-17 DIAGNOSIS — Z720 Tobacco use: Secondary | ICD-10-CM

## 2019-02-17 DIAGNOSIS — G459 Transient cerebral ischemic attack, unspecified: Secondary | ICD-10-CM

## 2019-02-17 DIAGNOSIS — E785 Hyperlipidemia, unspecified: Secondary | ICD-10-CM | POA: Diagnosis present

## 2019-02-17 DIAGNOSIS — E7800 Pure hypercholesterolemia, unspecified: Secondary | ICD-10-CM

## 2019-02-17 DIAGNOSIS — Z791 Long term (current) use of non-steroidal anti-inflammatories (NSAID): Secondary | ICD-10-CM

## 2019-02-17 DIAGNOSIS — R4781 Slurred speech: Secondary | ICD-10-CM | POA: Diagnosis present

## 2019-02-17 DIAGNOSIS — R29810 Facial weakness: Secondary | ICD-10-CM | POA: Diagnosis present

## 2019-02-17 DIAGNOSIS — E669 Obesity, unspecified: Secondary | ICD-10-CM | POA: Diagnosis present

## 2019-02-17 DIAGNOSIS — Z79899 Other long term (current) drug therapy: Secondary | ICD-10-CM

## 2019-02-17 DIAGNOSIS — Z1159 Encounter for screening for other viral diseases: Secondary | ICD-10-CM

## 2019-02-17 DIAGNOSIS — N951 Menopausal and female climacteric states: Secondary | ICD-10-CM | POA: Diagnosis present

## 2019-02-17 DIAGNOSIS — I10 Essential (primary) hypertension: Secondary | ICD-10-CM | POA: Diagnosis present

## 2019-02-17 DIAGNOSIS — I6523 Occlusion and stenosis of bilateral carotid arteries: Secondary | ICD-10-CM | POA: Diagnosis present

## 2019-02-17 DIAGNOSIS — R001 Bradycardia, unspecified: Secondary | ICD-10-CM | POA: Diagnosis not present

## 2019-02-17 DIAGNOSIS — I739 Peripheral vascular disease, unspecified: Secondary | ICD-10-CM | POA: Diagnosis present

## 2019-02-17 LAB — CBC WITH DIFFERENTIAL/PLATELET
Abs Immature Granulocytes: 0.03 10*3/uL (ref 0.00–0.07)
Basophils Absolute: 0.1 10*3/uL (ref 0.0–0.1)
Basophils Relative: 1 %
Eosinophils Absolute: 0.2 10*3/uL (ref 0.0–0.5)
Eosinophils Relative: 2 %
HCT: 42.6 % (ref 36.0–46.0)
Hemoglobin: 12.8 g/dL (ref 12.0–15.0)
Immature Granulocytes: 0 %
Lymphocytes Relative: 24 %
Lymphs Abs: 2.8 10*3/uL (ref 0.7–4.0)
MCH: 25.4 pg — ABNORMAL LOW (ref 26.0–34.0)
MCHC: 30 g/dL (ref 30.0–36.0)
MCV: 84.5 fL (ref 80.0–100.0)
Monocytes Absolute: 0.7 10*3/uL (ref 0.1–1.0)
Monocytes Relative: 6 %
Neutro Abs: 7.8 10*3/uL — ABNORMAL HIGH (ref 1.7–7.7)
Neutrophils Relative %: 67 %
Platelets: 363 10*3/uL (ref 150–400)
RBC: 5.04 MIL/uL (ref 3.87–5.11)
RDW: 14.5 % (ref 11.5–15.5)
WBC: 11.6 10*3/uL — ABNORMAL HIGH (ref 4.0–10.5)
nRBC: 0 % (ref 0.0–0.2)

## 2019-02-17 LAB — COMPREHENSIVE METABOLIC PANEL
ALT: 14 U/L (ref 0–44)
AST: 15 U/L (ref 15–41)
Albumin: 3.4 g/dL — ABNORMAL LOW (ref 3.5–5.0)
Alkaline Phosphatase: 82 U/L (ref 38–126)
Anion gap: 11 (ref 5–15)
BUN: 16 mg/dL (ref 8–23)
CO2: 26 mmol/L (ref 22–32)
Calcium: 9.1 mg/dL (ref 8.9–10.3)
Chloride: 105 mmol/L (ref 98–111)
Creatinine, Ser: 0.96 mg/dL (ref 0.44–1.00)
GFR calc Af Amer: >60 mL/min (ref >60)
GFR calc non Af Amer: >60 mL/min (ref >60)
Glucose, Bld: 98 mg/dL (ref 70–99)
Potassium: 4.5 mmol/L (ref 3.5–5.1)
Sodium: 142 mmol/L (ref 135–145)
Total Bilirubin: 0.2 mg/dL — ABNORMAL LOW (ref 0.3–1.2)
Total Protein: 6.9 g/dL (ref 6.5–8.1)

## 2019-02-17 LAB — CBG MONITORING, ED: Glucose-Capillary: 99 mg/dL (ref 70–99)

## 2019-02-17 LAB — TSH: TSH: 0.612 u[IU]/mL (ref 0.350–4.500)

## 2019-02-17 MED ORDER — ACETAMINOPHEN 325 MG PO TABS: 650.0000 mg | ORAL_TABLET | ORAL | Status: DC | PRN

## 2019-02-17 MED ORDER — ACETAMINOPHEN 160 MG/5ML PO SOLN: 650.0000 mg | ORAL | Status: DC | PRN

## 2019-02-17 MED ORDER — ACETAMINOPHEN 650 MG RE SUPP: 650.0000 mg | RECTAL | Status: DC | PRN

## 2019-02-17 MED ORDER — STROKE: EARLY STAGES OF RECOVERY BOOK
Freq: Once | Status: DC
  Filled 2019-02-17: qty 1

## 2019-02-17 MED ORDER — SODIUM CHLORIDE 0.9 % IV SOLN
INTRAVENOUS | Status: AC
  Administered 2019-02-17: 23:00:00 via INTRAVENOUS

## 2019-02-17 MED ORDER — ENOXAPARIN SODIUM 40 MG/0.4ML ~~LOC~~ SOLN
40.0000 mg | Freq: Every day | SUBCUTANEOUS | Status: DC
  Administered 2019-02-18 – 2019-02-19 (×2): 40 mg via SUBCUTANEOUS
  Filled 2019-02-17 (×2): qty 0.4

## 2019-02-17 NOTE — ED Notes (Signed)
Attempted report x1. 

## 2019-02-17 NOTE — H&P (Addendum)
Family Medicine [facility] Admission History and Physical Service Pager: [phone]  Patient name: Sheila Webster Medical record number: [id number] Date of birth: 03-Jul-1956 Age: 63 y.o. Gender: female  Primary Care Provider: No Pcp, Per Patient (Inactive) Consultants: Neurology Code Status: Full Emergency Contact: Rudell CobbJevondra Udovich: [phone]  Chief Complaint: Slurred speech, left-sided facial droop and left arm weakness, now resolved  Assessment and Plan: Sheila Webster is a 63 y.o. female presenting with slurred speech and resolved left-sided facial droop with left arm weakness. PMH is significant for tobacco use, hypertension.  Focal neurologic deficits: Appear to be consistent with either a TIA or CVA.  Patient notes that her speech is still not back to normal, but her left upper extremity weakness and left-sided facial droop have resolved.  Symptoms were first noticed this morning, so she was outside the window of TPA administration on presentation.  CT head was negative for acute findings but did show minimal small vessel ischemic change.  Vitals were within normal limits except for an increased BP of 140/95.  EKG with sinus rhythm.  Will admit for further work-up of TIA versus CVA. -Admit to observation, medical telemetry, attending Dr. Lum BabeEniola -Neurology consult, appreciate recommendations -MRI brain -Echo -Carotid Doppler -Permissive hypertension -Hemoglobin A1c, lipid panel, TSH -Regular diet after passes swallow screen -NS 100 mL/h for 10 hours -PT/OT/SLP assessment -Case management for setting up with PCP and applying for Medicaid  Tobacco use: Patient endorses smoking about 1/4 pack/day.  She declines a nicotine patch at this time. -Patient counseled on tobacco cessation -Nicotine patch on patient request  Hypertension: Listed on problem list in chart, but patient does not have this followed and does not take antihypertensive medications. -Permissive  hypertension -May benefit from antihypertensive on discharge  FEN/GI: NS @ 100 ml/hr, regular diet once passes swallow screen Prophylaxis: Lovenox  Disposition: observation  History of Present Illness:  Sheila Webster is a 63 y.o. female presenting with slight slurred speech and reports of left-sided facial droop and left upper extremity weakness.  She reports that she noticed a little left arm weakness this morning when getting out of bed at around 7 AM, and she spoke with her daughter and her sister this morning, who both noted that her speech seemed to be a bit slurred.  Later today, she was playing cards with some family members and they also noted a left-sided facial droop and slurred speech, so she decided to come into the ED for evaluation.  She says that this is never occurred to her before.  She denies any other symptoms other than occasional hot flashes.  She does not currently have a PCP but is interested in obtaining one.  She endorses taking ibuprofen as needed for her knee pain and an apple cider vinegar supplement for weight loss but does not take any other medications.  She endorses tobacco use but denies all other substance use.  In the ED, a CT head was performed, which did not show any acute changes, although small vessel ischemic changes were seen.  Labs were unremarkable overall, and vital signs were within normal limits except for mildly elevated BP.  Review Of Systems: Per HPI with the following additions:   Review of Systems  Constitutional: Negative for fever, malaise/fatigue and weight loss.  HENT: Negative for congestion.   Eyes: Negative for blurred vision.  Respiratory: Negative for cough and shortness of breath.   Cardiovascular: Negative for chest pain, palpitations and leg swelling.  Gastrointestinal: Positive  for heartburn. Negative for abdominal pain.  Genitourinary: Negative for dysuria.  Neurological: Positive for speech change and focal weakness. Negative  for dizziness.  Endo/Heme/Allergies: Does not bruise/bleed easily.  Psychiatric/Behavioral: Negative for depression and substance abuse. The patient is not nervous/anxious.     Patient Active Problem List   Diagnosis Date Noted  . CVA (cerebral vascular accident) ([place]) 02/17/2019    Past Medical History: Past Medical History:  Diagnosis Date  . Hypertension   . Irregular heartbeat     Past Surgical History: Past Surgical History:  Procedure Laterality Date  . BREAST SURGERY    . KNEE SURGERY Right     Social History: Social History   Tobacco Use  . Smoking status: Current Every Day Smoker    Packs/day: 0.50    Types: Cigarettes  Substance Use Topics  . Alcohol use: No  . Drug use: No   Additional social history:   Please also refer to relevant sections of EMR.  Family History: Family History  Problem Relation Age of Onset  . Cancer Mother     Allergies and Medications: No Known Allergies No current facility-administered medications on file prior to encounter.    Current Outpatient Medications on File Prior to Encounter  Medication Sig Dispense Refill  . erythromycin ophthalmic ointment Place a 1/2 inch ribbon of ointment into the lower eyelid. 1 g 0  . ibuprofen (ADVIL,MOTRIN) 200 MG tablet Take 200-800 mg by mouth every 6 (six) hours as needed for moderate pain.    Marland Kitchen ibuprofen (ADVIL,MOTRIN) 800 MG tablet Take 1 tablet (800 mg total) by mouth 3 (three) times daily. 21 tablet 0  . methocarbamol (ROBAXIN) 500 MG tablet Take 2 tablets (1,000 mg total) by mouth 4 (four) times daily. 20 tablet 0  . naproxen sodium (ANAPROX) 220 MG tablet Take 220 mg by mouth 2 (two) times daily with a meal.    . trimethoprim-polymyxin b (POLYTRIM) ophthalmic solution Place 1 drop into the left eye 4 (four) times daily. For five days (Patient not taking: Reported on 12/24/2014) 10 mL 0    Objective: BP (!) 140/95   Pulse 87   Temp 98.4 F (36.9 C) (Oral)   Resp 16   Ht 5\' 4"   (1.626 m)   Wt 97.5 kg   SpO2 99%   BMI 36.90 kg/m  Physical Exam Constitutional:      General: She is not in acute distress.    Appearance: Normal appearance. She is not ill-appearing.  HENT:     Head: Normocephalic and atraumatic.     Nose: No congestion or rhinorrhea.     Mouth/Throat:     Mouth: Mucous membranes are moist.  Eyes:     Conjunctiva/sclera: Conjunctivae normal.  Neck:     Musculoskeletal: Normal range of motion.  Cardiovascular:     Rate and Rhythm: Normal rate and regular rhythm.     Pulses: Normal pulses.  Pulmonary:     Effort: Pulmonary effort is normal. No respiratory distress.  Abdominal:     General: Abdomen is flat. Bowel sounds are normal.     Palpations: Abdomen is soft.     Tenderness: There is no abdominal tenderness.  Musculoskeletal: Normal range of motion.        General: No swelling or tenderness.     Right lower leg: No edema.     Left lower leg: No edema.  Skin:    General: Skin is warm and dry.  Neurological:  Mental Status: She is alert and oriented to person, place, and time.     GCS: GCS eye subscore is 4. GCS verbal subscore is 5. GCS motor subscore is 6.     Cranial Nerves: Cranial nerves are intact. No facial asymmetry.     Sensory: No sensory deficit.     Motor: No weakness.     Coordination: Romberg sign negative. Coordination normal. Finger-Nose-Finger Test normal.     Comments: Mild slurring of speech  Psychiatric:        Mood and Affect: Mood normal.        Behavior: Behavior normal.        Thought Content: Thought content normal.        Judgment: Judgment normal.      Labs and Imaging: CBC BMET  Recent Labs  Lab 02/17/19 1955  WBC 11.6*  HGB 12.8  HCT 42.6  PLT 363   Recent Labs  Lab 02/17/19 1955  NA 142  K 4.5  CL 105  CO2 26  BUN 16  CREATININE 0.96  GLUCOSE 98  CALCIUM 9.1     Ct Head Wo Contrast  Result Date: 02/17/2019 CLINICAL DATA:  Slurred speech left facial droop EXAM: CT HEAD  WITHOUT CONTRAST TECHNIQUE: Contiguous axial images were obtained from the base of the skull through the vertex without intravenous contrast. COMPARISON:  None. FINDINGS: Brain: No acute territorial infarction, hemorrhage or intracranial mass. Chunky calcification left cranial vertex anteriorly, possible calcified meningioma. Minimal small vessel ischemic changes of the white matter. Normal ventricle size. Vascular: No hyperdense vessels. Scattered calcifications at the carotid siphon Skull: Normal. Negative for fracture or focal lesion. Sinuses/Orbits: No acute finding. Patchy mucosal thickening in the ethmoid sinuses. Other: None IMPRESSION: No CT evidence for acute intracranial abnormality. Minimal small vessel ischemic change of the white matter Electronically Signed   By: Jasmine PangKim  Fujinaga M.D.   On: 02/17/2019 19:54     Lennox SoldersWinfrey, Amanda C, MD 02/17/2019, 10:05 PM PGY-2, [facility] FPTS Intern pager: [phone], text pages welcome

## 2019-02-17 NOTE — ED Notes (Signed)
ED TO INPATIENT HANDOFF REPORT  ED Nurse Name and Phone #: Leatha Gildingmallory [phone]  S Name/Age/Gender Sheila Webster D Kane 63 y.o. female Room/Bed: 018C/018C  Code Status   Code Status: Full Code  Home/SNF/Other Home Patient oriented to: self, place, time and situation Is this baseline? Yes   Triage Complete: Triage complete  Chief Complaint stroke sx,htn  Triage Note Pt has left sided facial droop and slurred speech. Pt and sister state she was normal last night and woke up with the symptoms. Pt ambulatory without difficulty with no weakness. States both of her arms feel tight and her BP was high.    Allergies No Known Allergies  Level of Care/Admitting Diagnosis ED Disposition    ED Disposition Condition Comment   Admit  Hospital Area: [facility] [[id number]]  Level of Care: Telemetry Medical [104]  Covid Evaluation: N/A  Diagnosis: CVA (cerebral vascular accident) [facility](HCC) [[id number]]  Admitting Physician: Lennox SoldersWINFREY, AMANDA C [[id number]]  Attending Physician: Janit PaganENIOLA, KEHINDE T [[id number]]  PT Class (Do Not Modify): Observation [104]  PT Acc Code (Do Not Modify): Observation [10022]       B Medical/Surgery History Past Medical History:  Diagnosis Date  . Hypertension   . Irregular heartbeat    Past Surgical History:  Procedure Laterality Date  . BREAST SURGERY    . KNEE SURGERY Right      A IV Location/Drains/Wounds Patient Lines/Drains/Airways Status   Active Line/Drains/Airways    Name:   Placement date:   Placement time:   Site:   Days:   Peripheral IV 02/17/19 Left Hand   02/17/19    2144    Hand   less than 1          Intake/Output Last 24 hours No intake or output data in the 24 hours ending 02/17/19 2226  Labs/Imaging Results for orders placed or performed during the hospital encounter of 02/17/19 (from the past 48 hour(s))  CBG monitoring, ED     Status: None   Collection Time: 02/17/19  6:42 PM  Result Value Ref Range   Glucose-Capillary 99 70  - 99 mg/dL   Comment 1 Notify RN    Comment 2 Document in Chart   CBC with Differential     Status: Abnormal   Collection Time: 02/17/19  7:55 PM  Result Value Ref Range   WBC 11.6 (H) 4.0 - 10.5 K/uL   RBC 5.04 3.87 - 5.11 MIL/uL   Hemoglobin 12.8 12.0 - 15.0 g/dL   HCT 29.542.6 62.136.0 - 30.846.0 %   MCV 84.5 80.0 - 100.0 fL   MCH 25.4 (L) 26.0 - 34.0 pg   MCHC 30.0 30.0 - 36.0 g/dL   RDW 65.714.5 84.611.5 - 96.215.5 %   Platelets 363 150 - 400 K/uL   nRBC 0.0 0.0 - 0.2 %   Neutrophils Relative % 67 %   Neutro Abs 7.8 (H) 1.7 - 7.7 K/uL   Lymphocytes Relative 24 %   Lymphs Abs 2.8 0.7 - 4.0 K/uL   Monocytes Relative 6 %   Monocytes Absolute 0.7 0.1 - 1.0 K/uL   Eosinophils Relative 2 %   Eosinophils Absolute 0.2 0.0 - 0.5 K/uL   Basophils Relative 1 %   Basophils Absolute 0.1 0.0 - 0.1 K/uL   Immature Granulocytes 0 %   Abs Immature Granulocytes 0.03 0.00 - 0.07 K/uL    Comment: Performed at [facility], [address] [place], [place] [address]  Comprehensive metabolic panel  Status: Abnormal   Collection Time: 02/17/19  7:55 PM  Result Value Ref Range   Sodium 142 135 - 145 mmol/L   Potassium 4.5 3.5 - 5.1 mmol/L   Chloride 105 98 - 111 mmol/L   CO2 26 22 - 32 mmol/L   Glucose, Bld 98 70 - 99 mg/dL   BUN 16 8 - 23 mg/dL   Creatinine, Ser 1.610.96 0.44 - 1.00 mg/dL   Calcium 9.1 8.9 - 09.610.3 mg/dL   Total Protein 6.9 6.5 - 8.1 g/dL   Albumin 3.4 (L) 3.5 - 5.0 g/dL   AST 15 15 - 41 U/L   ALT 14 0 - 44 U/L   Alkaline Phosphatase 82 38 - 126 U/L   Total Bilirubin 0.2 (L) 0.3 - 1.2 mg/dL   GFR calc non Af Amer >60 >60 mL/min   GFR calc Af Amer >60 >60 mL/min   Anion gap 11 5 - 15    Comment: Performed at [facility], [address] [place], [place] [address]   Ct Head Wo Contrast  Result Date: 02/17/2019 CLINICAL DATA:  Slurred speech left facial droop EXAM: CT HEAD WITHOUT CONTRAST TECHNIQUE: Contiguous axial images were obtained from the base of the skull through the  vertex without intravenous contrast. COMPARISON:  None. FINDINGS: Brain: No acute territorial infarction, hemorrhage or intracranial mass. Chunky calcification left cranial vertex anteriorly, possible calcified meningioma. Minimal small vessel ischemic changes of the white matter. Normal ventricle size. Vascular: No hyperdense vessels. Scattered calcifications at the carotid siphon Skull: Normal. Negative for fracture or focal lesion. Sinuses/Orbits: No acute finding. Patchy mucosal thickening in the ethmoid sinuses. Other: None IMPRESSION: No CT evidence for acute intracranial abnormality. Minimal small vessel ischemic change of the white matter Electronically Signed   By: Jasmine PangKim  Fujinaga M.D.   On: 02/17/2019 19:54    Pending Labs Unresulted Labs (From admission, onward)    Start     Ordered   02/18/19 0500  Lipid panel  Tomorrow morning,   R    Comments: Fasting    02/17/19 2204   02/17/19 2221  TSH  Once,   STAT     02/17/19 2220   02/17/19 2201  Urine rapid drug screen (hosp performed)not at [facility]  Once,   STAT     02/17/19 2204   02/17/19 2158  HIV antibody (Routine Testing)  Once,   STAT     02/17/19 2204   02/17/19 1955  Hemoglobin A1c  Once,   R     02/17/19 1955   02/17/19 1927  Novel Coronavirus,NAA,(SEND-OUT TO REF LAB - TAT 24-48 hrs); Hosp Order  (Asymptomatic Patients Labs)  Once,   STAT    Question:  Rule Out  Answer:  Yes   02/17/19 1926   02/17/19 1926  Urinalysis, Routine w reflex microscopic  ONCE - STAT,   STAT     02/17/19 1925          Vitals/Pain Today's Vitals   02/17/19 1930 02/17/19 2015 02/17/19 2215 02/17/19 2225  BP: (!) 140/95  127/89 127/89  Pulse: 87 87  77  Resp: (!) 27 16 (!) 21 (!) 22  Temp:      TempSrc:      SpO2: 97% 99%  99%  Weight:      Height:      PainSc:        Isolation Precautions No active isolations  Medications Medications   stroke: mapping our early stages of recovery  book (has no administration in time range)  0.9 %   sodium chloride infusion (has no administration in time range)  acetaminophen (TYLENOL) tablet 650 mg (has no administration in time range)    Or  acetaminophen (TYLENOL) solution 650 mg (has no administration in time range)    Or  acetaminophen (TYLENOL) suppository 650 mg (has no administration in time range)  enoxaparin (LOVENOX) injection 40 mg (has no administration in time range)    Mobility walks Low fall risk   Focused Assessments Neuro Assessment Handoff:  Swallow screen pass? Yes    NIH Stroke Scale ( + Modified Stroke Scale Criteria)  Interval: Initial Level of Consciousness (1a.)   : Alert, keenly responsive LOC Questions (1b. )   +: Answers both questions correctly LOC Commands (1c. )   + : Performs both tasks correctly Best Gaze (2. )  +: Normal Visual (3. )  +: No visual loss Facial Palsy (4. )    : Normal symmetrical movements Motor Arm, Left (5a. )   +: No drift Motor Arm, Right (5b. )   +: No drift Motor Leg, Left (6a. )   +: No drift Motor Leg, Right (6b. )   +: No drift Limb Ataxia (7. ): Absent Sensory (8. )   +: Mild-to-moderate sensory loss, patient feels pinprick is less sharp or is dull on the affected side, or there is a loss of superficial pain with pinprick, but patient is aware of being touched(decreased sensation to the left side of face) Best Language (9. )   +: Mild-to-moderate aphasia(slurred speech) Dysarthria (10. ): Normal Extinction/Inattention (11.)   +: No Abnormality Modified SS Total  +: 2 Complete NIHSS TOTAL: 2     Neuro Assessment:   Neuro Checks:   Initial (02/17/19 1838)  Last Documented NIHSS Modified Score: 2 (02/17/19 1838) Has TPA been given? No If patient is a Neuro Trauma and patient is going to OR before floor call report to [place] nurse: [phone] or [phone]     R Recommendations: See Admitting Provider Note  Report given to:   Additional Notes:

## 2019-02-17 NOTE — ED Provider Notes (Signed)
[facility] EMERGENCY DEPARTMENT Provider Note   CSN: [id number] Arrival date & time: 02/17/19  1829    History   Chief Complaint Chief Complaint  Patient presents with  . Neurologic Problem    HPI Leanora CoverRobin D Railsback is a 63 y.o. female.     The history is provided by the patient.  Neurologic Problem This is a new problem. The current episode started 12 to 24 hours ago. The problem has been resolved. Pertinent negatives include no chest pain, no abdominal pain, no headaches and no shortness of breath. Associated symptoms comments: Slurred speech, left sided facial weakness, LUE weakness now resolved. . Nothing relieves the symptoms. She has tried nothing for the symptoms. The treatment provided no relief.    Past Medical History:  Diagnosis Date  . Hypertension   . Irregular heartbeat     Patient Active Problem List   Diagnosis Date Noted  . CVA (cerebral vascular accident) (HCC) 02/17/2019  . Essential hypertension   . Tobacco abuse     Past Surgical History:  Procedure Laterality Date  . BREAST SURGERY    . KNEE SURGERY Right      OB History   No obstetric history on file.      Home Medications    Prior to Admission medications   Medication Sig Start Date End Date Taking? Authorizing Provider  APPLE CIDER VINEGAR PO Take 450 mg by mouth 3 (three) times daily.   Yes [provider]  ibuprofen (ADVIL,MOTRIN) 200 MG tablet Take 800 mg by mouth every 6 (six) hours as needed for headache (pain).    Yes [provider]    Family History Family History  Problem Relation Age of Onset  . Cancer Mother     Social History Social History   Tobacco Use  . Smoking status: Current Every Day Smoker    Packs/day: 0.50    Types: Cigarettes  Substance Use Topics  . Alcohol use: No  . Drug use: No     Allergies   Patient has no known allergies.   Review of Systems Review of Systems  Constitutional: Negative for chills  and fever.  HENT: Negative for ear pain and sore throat.   Eyes: Negative for pain and visual disturbance.  Respiratory: Negative for cough and shortness of breath.   Cardiovascular: Negative for chest pain and palpitations.  Gastrointestinal: Negative for abdominal pain and vomiting.  Genitourinary: Negative for dysuria and hematuria.  Musculoskeletal: Negative for arthralgias and back pain.  Skin: Negative for color change and rash.  Neurological: Positive for facial asymmetry and speech difficulty. Negative for dizziness, tremors, seizures, syncope, weakness, light-headedness, numbness and headaches.  All other systems reviewed and are negative.    Physical Exam Updated Vital Signs  ED Triage Vitals  Enc Vitals Group     BP 02/17/19 1840 (!) 154/107     Pulse Rate 02/17/19 1840 95     Resp 02/17/19 1840 (!) 22     Temp 02/17/19 1840 98.4 F (36.9 C)     Temp Source 02/17/19 1840 Oral     SpO2 02/17/19 1840 97 %     Weight 02/17/19 1836 215 lb (97.5 kg)     Height 02/17/19 1836 5\' 4"  (1.626 m)     Head Circumference --      Peak Flow --      Pain Score 02/17/19 1836 0     Pain Loc --      Pain Edu? --  Excl. in GC? --     Physical Exam Vitals signs and nursing note reviewed.  Constitutional:      General: She is not in acute distress.    Appearance: She is well-developed. She is not ill-appearing.  HENT:     Head: Normocephalic and atraumatic.     Nose: Nose normal.     Mouth/Throat:     Mouth: Mucous membranes are moist.  Eyes:     Extraocular Movements: Extraocular movements intact.     Conjunctiva/sclera: Conjunctivae normal.     Pupils: Pupils are equal, round, and reactive to light.  Neck:     Musculoskeletal: Neck supple.  Cardiovascular:     Rate and Rhythm: Normal rate and regular rhythm.     Heart sounds: Normal heart sounds. No murmur.  Pulmonary:     Effort: Pulmonary effort is normal. No respiratory distress.     Breath sounds: Normal breath  sounds.  Abdominal:     General: Abdomen is flat.     Palpations: Abdomen is soft.     Tenderness: There is no abdominal tenderness.  Skin:    General: Skin is warm and dry.  Neurological:     General: No focal deficit present.     Mental Status: She is alert and oriented to person, place, and time.     Cranial Nerves: No cranial nerve deficit.     Sensory: No sensory deficit.     Motor: No weakness.     Coordination: Coordination normal.     Gait: Gait normal.     Comments: 5+ out of 5 strength throughout, normal sensation, no drift, normal speech, no facial droop  Psychiatric:        Mood and Affect: Mood normal.      ED Treatments / Results  Labs (all labs ordered are listed, but only abnormal results are displayed) Labs Reviewed  CBC WITH DIFFERENTIAL/PLATELET - Abnormal; Notable for the following components:      Result Value   WBC 11.6 (*)    MCH 25.4 (*)    Neutro Abs 7.8 (*)    All other components within normal limits  COMPREHENSIVE METABOLIC PANEL - Abnormal; Notable for the following components:   Albumin 3.4 (*)    Total Bilirubin 0.2 (*)    All other components within normal limits  NOVEL CORONAVIRUS, NAA (HOSPITAL ORDER, SEND-OUT TO REF LAB)  URINALYSIS, ROUTINE W REFLEX MICROSCOPIC  HEMOGLOBIN A1C  HIV ANTIBODY (ROUTINE TESTING W REFLEX)  LIPID PANEL  RAPID URINE DRUG SCREEN, HOSP PERFORMED  TSH  CBG MONITORING, ED    EKG EKG Interpretation  Date/Time:  Saturday February 17 2019 18:42:45 EDT Ventricular Rate:  99 PR Interval:    QRS Duration: 96 QT Interval:  363 QTC Calculation: 466 R Axis:   -11 Text Interpretation:  Sinus rhythm Confirmed by Virgina NorfolkAdam, Jadamarie Butson [phone]([id number]) on 02/17/2019 7:08:03 PM   Radiology Ct Head Wo Contrast  Result Date: 02/17/2019 CLINICAL DATA:  Slurred speech left facial droop EXAM: CT HEAD WITHOUT CONTRAST TECHNIQUE: Contiguous axial images were obtained from the base of the skull through the vertex without intravenous  contrast. COMPARISON:  None. FINDINGS: Brain: No acute territorial infarction, hemorrhage or intracranial mass. Chunky calcification left cranial vertex anteriorly, possible calcified meningioma. Minimal small vessel ischemic changes of the white matter. Normal ventricle size. Vascular: No hyperdense vessels. Scattered calcifications at the carotid siphon Skull: Normal. Negative for fracture or focal lesion. Sinuses/Orbits: No acute finding. Patchy mucosal thickening in the  ethmoid sinuses. Other: None IMPRESSION: No CT evidence for acute intracranial abnormality. Minimal small vessel ischemic change of the white matter Electronically Signed   By: Donavan Foil M.D.   On: 02/17/2019 19:54    Procedures Procedures (including critical care time)  Medications Ordered in ED Medications   stroke: mapping our early stages of recovery book (has no administration in time range)  0.9 %  sodium chloride infusion (has no administration in time range)  acetaminophen (TYLENOL) tablet 650 mg (has no administration in time range)    Or  acetaminophen (TYLENOL) solution 650 mg (has no administration in time range)    Or  acetaminophen (TYLENOL) suppository 650 mg (has no administration in time range)  enoxaparin (LOVENOX) injection 40 mg (has no administration in time range)     Initial Impression / Assessment and Plan / ED Course  I have reviewed the triage vital signs and the nursing notes.  Pertinent labs & imaging results that were available during my care of the patient were reviewed by me and considered in my medical decision making (see chart for details).     MAZZY SANTARELLI is a 63 year old female with history of hypertension who presents to the ED with strokelike symptoms that have now resolved.  Patient with normal vitals.  No fever.  Patient states that she had episode of slurred speech, left-sided facial weakness possibly left upper extremity weakness that started when she woke up this morning.   That was about 12 hours ago.  Symptoms have mostly resolved.  Family noticed slurred speech when she first woke up and then developed some left-sided facial weakness.  Possibly has some left upper extremity weakness.  Overall she appears to be neurologically intact.  Does not appear to have major slurred speech or aphasia.  Maybe has some decreased sensation to the left side of the face.  Overall has normal strength and sensation.  Although grossly she appears neurologically intact.  Suspect possibly TIA.  Lab work showed no significant anemia, electrolyte abnormality, kidney injury.  CT head unremarkable but does have some small vessel ischemic changes.  Will admit to medicine service for further TIA work-up.  Hemodynamically stable throughout my care.  This chart was dictated using voice recognition software.  Despite best efforts to proofread,  errors can occur which can change the documentation meaning.    Final Clinical Impressions(s) / ED Diagnoses   Final diagnoses:  TIA (transient ischemic attack)    ED Discharge Orders    None       Lennice Sites, DO 02/17/19 2253

## 2019-02-17 NOTE — Consult Note (Addendum)
Referring Physician: Dr. Lum BabeEniola    Chief Complaint: TIA versus stroke  HPI: Sheila Webster is an 63 y.o. female who presented to the ED with slurred speech, LUE weakness and left sided facial droop. LKN was Friday night. She woke up on Saturday morning with the symptoms. She was ambulatory in Triage with no weakness. She did not complain of any headaches, SOB, CP or abdominal pain. CT head showed no acute abnormality; minimal chronic small vessel ischemic changes were noted.  Subsequent MRI revealed a 2 cm linear acute/early subacute infarction within the left mid corona radiata. No associated hemorrhage or mass effect. Mild chronic microvascular ischemic changes and volume loss of the brain.  At time of Neurology consultation, she denies headache, vision changes, confusion, dysphasia, CP, SOB, abdominal pain, limb weakness or limb numbness.       Past Medical History:  Diagnosis Date  . Hypertension   . Irregular heartbeat     Past Surgical History:  Procedure Laterality Date  . BREAST SURGERY    . KNEE SURGERY Right     Family History  Problem Relation Age of Onset  . Cancer Mother    Social History:  reports that she has been smoking cigarettes. She has been smoking about 0.50 packs per day. She does not have any smokeless tobacco history on file. She reports that she does not drink alcohol or use drugs.  Allergies: No Known Allergies  Medications:  Prior to Admission:  Medications Prior to Admission  Medication Sig Dispense Refill Last Dose  . APPLE CIDER VINEGAR PO Take 450 mg by mouth 3 (three) times daily.   02/17/2019 at noon  . ibuprofen (ADVIL,MOTRIN) 200 MG tablet Take 800 mg by mouth every 6 (six) hours as needed for headache (pain).    02/17/2019 at am   Scheduled: .  stroke: mapping our early stages of recovery book   Does not apply Once  . enoxaparin (LOVENOX) injection  40 mg Subcutaneous Daily    ROS: Positive for heartburn sensation. Other ROS as per HPI,  with all other systems negative.   Physical Examination: Blood pressure (!) 140/95, pulse 87, temperature 98.4 F (36.9 C), temperature source Oral, resp. rate 16, height 5\' 4"  (1.626 m), weight 97.5 kg, SpO2 99 %.  HEENT: [employer]/AT Lungs: Respirations unlabored Ext: No edema  Neurologic Examination: Mental Status: Alert, fully oriented, thought content appropriate.  Speech fluent without evidence of aphasia.  Naming intact. Able to follow all commands without difficulty. Cranial Nerves: II:  Visual fields intact with no extinction to DSS. PERRL  III,IV, VI: ptosis not present, EOMI without nystagmus V,VII: No facial droop. Temp sensation equal bilaterally  VIII: hearing intact to voice IX,X: Palate rises symmetrically XI: Symmetric XII: midline tongue extension  Motor: Right : Upper extremity   5/5    Left:     Upper extremity   5/5  Lower extremity   5/5     Lower extremity   5/5 Tone and bulk are normal.  No pronator drift.  Sensory: Temp equal BUE. Decreased temp LLE. FT intact with no extinction to DSS.  Deep Tendon Reflexes:  RUE 1+ brachioradialis and biceps LUE 2+ brachioradialis and biceps Patellae 2+ bilaterally  Cerebellar: No ataxia with FNF bilaterally Gait: Deferred  Results for orders placed or performed during the hospital encounter of 02/17/19 (from the past 48 hour(s))  CBG monitoring, ED     Status: None   Collection Time: 02/17/19  6:42 PM  Result  Value Ref Range   Glucose-Capillary 99 70 - 99 mg/dL   Comment 1 Notify RN    Comment 2 Document in Chart   CBC with Differential     Status: Abnormal   Collection Time: 02/17/19  7:55 PM  Result Value Ref Range   WBC 11.6 (H) 4.0 - 10.5 K/uL   RBC 5.04 3.87 - 5.11 MIL/uL   Hemoglobin 12.8 12.0 - 15.0 g/dL   HCT 21.342.6 08.636.0 - 57.846.0 %   MCV 84.5 80.0 - 100.0 fL   MCH 25.4 (L) 26.0 - 34.0 pg   MCHC 30.0 30.0 - 36.0 g/dL   RDW 46.914.5 62.911.5 - 52.815.5 %   Platelets 363 150 - 400 K/uL   nRBC 0.0 0.0 - 0.2 %    Neutrophils Relative % 67 %   Neutro Abs 7.8 (H) 1.7 - 7.7 K/uL   Lymphocytes Relative 24 %   Lymphs Abs 2.8 0.7 - 4.0 K/uL   Monocytes Relative 6 %   Monocytes Absolute 0.7 0.1 - 1.0 K/uL   Eosinophils Relative 2 %   Eosinophils Absolute 0.2 0.0 - 0.5 K/uL   Basophils Relative 1 %   Basophils Absolute 0.1 0.0 - 0.1 K/uL   Immature Granulocytes 0 %   Abs Immature Granulocytes 0.03 0.00 - 0.07 K/uL    Comment: Performed at [facility], [address] [place], [place] [address]  Comprehensive metabolic panel     Status: Abnormal   Collection Time: 02/17/19  7:55 PM  Result Value Ref Range   Sodium 142 135 - 145 mmol/L   Potassium 4.5 3.5 - 5.1 mmol/L   Chloride 105 98 - 111 mmol/L   CO2 26 22 - 32 mmol/L   Glucose, Bld 98 70 - 99 mg/dL   BUN 16 8 - 23 mg/dL   Creatinine, Ser 4.010.96 0.44 - 1.00 mg/dL   Calcium 9.1 8.9 - 02.710.3 mg/dL   Total Protein 6.9 6.5 - 8.1 g/dL   Albumin 3.4 (L) 3.5 - 5.0 g/dL   AST 15 15 - 41 U/L   ALT 14 0 - 44 U/L   Alkaline Phosphatase 82 38 - 126 U/L   Total Bilirubin 0.2 (L) 0.3 - 1.2 mg/dL   GFR calc non Af Amer >60 >60 mL/min   GFR calc Af Amer >60 >60 mL/min   Anion gap 11 5 - 15    Comment: Performed at [facility], [address] [place], [place] [address]   Ct Head Wo Contrast  Result Date: 02/17/2019 CLINICAL DATA:  Slurred speech left facial droop EXAM: CT HEAD WITHOUT CONTRAST TECHNIQUE: Contiguous axial images were obtained from the base of the skull through the vertex without intravenous contrast. COMPARISON:  None. FINDINGS: Brain: No acute territorial infarction, hemorrhage or intracranial mass. Chunky calcification left cranial vertex anteriorly, possible calcified meningioma. Minimal small vessel ischemic changes of the white matter. Normal ventricle size. Vascular: No hyperdense vessels. Scattered calcifications at the carotid siphon Skull: Normal. Negative for fracture or focal lesion. Sinuses/Orbits: No acute finding. Patchy  mucosal thickening in the ethmoid sinuses. Other: None IMPRESSION: No CT evidence for acute intracranial abnormality. Minimal small vessel ischemic change of the white matter Electronically Signed   By: Jasmine PangKim  Fujinaga M.D.   On: 02/17/2019 19:54    Assessment: 63 y.o. female presenting with acute onset of left facial droop and slurred speech.  1. MRI brain shows a small approximately 1 cm acute deep white matter ischemic infarction  on the left 2. Exam is nonfocal except for decreased temp sensation to LLE 3. Stroke Risk Factors - Tobacco use and HTN  Plan: 1. HgbA1c, fasting lipid panel 2. MRA of the brain without contrast 3. PT consult, OT consult, Speech consult 4. Echocardiogram 5. Carotid dopplers 6. Prophylactic therapy- Start ASA 81 mg po qd 7. Risk factor modification 8. Telemetry monitoring 9. Frequent neuro checks 10. Permissive HTN x 24 hours 11. Start atorvastatin 80 mg po qd. Obtain baseline CK level   @Electronically  signed: Dr. Kerney Elbe  02/17/2019, 10:19 PM

## 2019-02-17 NOTE — ED Triage Notes (Signed)
Pt has left sided facial droop and slurred speech. Pt and sister state she was normal last night and woke up with the symptoms. Pt ambulatory without difficulty with no weakness. States both of her arms feel tight and her BP was high.

## 2019-02-18 ENCOUNTER — Encounter (HOSPITAL_COMMUNITY): Payer: Self-pay

## 2019-02-18 DIAGNOSIS — I6330 Cerebral infarction due to thrombosis of unspecified cerebral artery: Secondary | ICD-10-CM

## 2019-02-18 DIAGNOSIS — E7800 Pure hypercholesterolemia, unspecified: Secondary | ICD-10-CM

## 2019-02-18 LAB — LIPID PANEL
Cholesterol: 218 mg/dL — ABNORMAL HIGH (ref 0–200)
HDL: 49 mg/dL (ref >40)
LDL Cholesterol: 152 mg/dL — ABNORMAL HIGH (ref 0–99)
Total CHOL/HDL Ratio: 4.4 RATIO
Triglycerides: 86 mg/dL (ref <150)
VLDL: 17 mg/dL (ref 0–40)

## 2019-02-18 LAB — HIV ANTIBODY (ROUTINE TESTING W REFLEX): HIV Screen 4th Generation wRfx: NONREACTIVE

## 2019-02-18 LAB — CBC
HCT: 37.9 % (ref 36.0–46.0)
Hemoglobin: 11.6 g/dL — ABNORMAL LOW (ref 12.0–15.0)
MCH: 25.6 pg — ABNORMAL LOW (ref 26.0–34.0)
MCHC: 30.6 g/dL (ref 30.0–36.0)
MCV: 83.5 fL (ref 80.0–100.0)
Platelets: 316 10*3/uL (ref 150–400)
RBC: 4.54 MIL/uL (ref 3.87–5.11)
RDW: 14.4 % (ref 11.5–15.5)
WBC: 9.3 10*3/uL (ref 4.0–10.5)
nRBC: 0 % (ref 0.0–0.2)

## 2019-02-18 LAB — BASIC METABOLIC PANEL
Anion gap: 7 (ref 5–15)
BUN: 13 mg/dL (ref 8–23)
CO2: 24 mmol/L (ref 22–32)
Calcium: 8.4 mg/dL — ABNORMAL LOW (ref 8.9–10.3)
Chloride: 108 mmol/L (ref 98–111)
Creatinine, Ser: 0.66 mg/dL (ref 0.44–1.00)
GFR calc Af Amer: >60 mL/min (ref >60)
GFR calc non Af Amer: >60 mL/min (ref >60)
Glucose, Bld: 123 mg/dL — ABNORMAL HIGH (ref 70–99)
Potassium: 3.7 mmol/L (ref 3.5–5.1)
Sodium: 139 mmol/L (ref 135–145)

## 2019-02-18 MED ORDER — ASPIRIN 81 MG PO CHEW
81.0000 mg | CHEWABLE_TABLET | Freq: Every day | ORAL | Status: DC
  Administered 2019-02-18 – 2019-02-19 (×2): 81 mg via ORAL
  Filled 2019-02-18 (×2): qty 1

## 2019-02-18 MED ORDER — ATORVASTATIN CALCIUM 80 MG PO TABS
80.0000 mg | ORAL_TABLET | Freq: Every day | ORAL | Status: DC
  Administered 2019-02-18: 80 mg via ORAL
  Filled 2019-02-18: qty 1

## 2019-02-18 NOTE — Progress Notes (Signed)
Spoke to Dr. Leonie Man, neurologist on-call, regarding patient.  Per Dr. Leonie Man patient will be stable for discharge once all of work-up is completed.  Currently urine drug screen, echo, Dopplers are still pending.  When patient is discharged she needs to be discharged with both aspirin and Plavix as well as statin therapy.  Appreciate neurology recommendations.  Dalphine Handing, PGY-2 [facility] 02/18/2019 2:08 PM

## 2019-02-18 NOTE — Progress Notes (Signed)
Family Medicine Teaching Service Daily Progress Note Intern Pager: [phone]  Patient name: Sheila Webster Medical record number: [id number] Date of birth: 1956-04-01 Age: 63 y.o. Gender: female  Primary Care Provider: No Pcp, Per Patient (Inactive) Consultants: Neurology Code Status: FULL  Pt Overview and Major Events to Date:  Admitted 6/20 for possible CVA 6/21 - MRI positive for infarction  Assessment and Plan:  Left mid corona radiata infarction: Found on MRI 6/21 likely responsible for her left upper extremity weakness, left-sided facial droop, and slurred speech.  LDL elevated to 152, total cholesterol 218.  TSH within normal limits.  Awaiting further risk stratification labs and imaging. -Appreciate neurology recommendations -Follow-up hemoglobin A1c, echo, carotid ultrasound -SLP, PT, OT -Start atorvastatin 80 mg -Start ASA 81 mg -Smoking cessation counseling -Permissive hypertension -Follow-up case management consult  Tobacco use: Patient endorses smoking about 1/4 pack/day.  She declines a nicotine patch at this time. -Patient counseled on the tobacco cessation -Nicotine patch on patient request  Hypertension: Normotensive since admission, last BP 102/61. -Permissive hypertension -May benefit from antihypertensive on discharge  FEN/GI: NPO until passes swallow screen PPx: Lovenox  Disposition: discharge pending further stroke workup   Subjective:  Patient has no complaints this morning and is aware of her MRI results.  She is okay with starting atorvastatin and ASA.  Objective: Temp:  [98.1 F (36.7 C)-98.4 F (36.9 C)] 98.1 F (36.7 C) (06/21 0611) Pulse Rate:  [71-95] 71 (06/21 0611) Resp:  [16-27] 16 (06/21 0611) BP: (102-155)/(83-128) 102/89 (06/21 0611) SpO2:  [96 %-100 %] 98 % (06/21 0611) Weight:  [97.5 kg] 97.5 kg (06/20 1836) Physical Exam: General: pleasant lady sitting comfortably in bed Cardiovascular: RRR, no MRG Respiratory:  CTAB Abdomen: soft, nontender, nondistended Extremities: 5/5 strength bilaterally in upper and lower extremities  Laboratory: Recent Labs  Lab 02/17/19 1955  WBC 11.6*  HGB 12.8  HCT 42.6  PLT 363   Recent Labs  Lab 02/17/19 1955  NA 142  K 4.5  CL 105  CO2 26  BUN 16  CREATININE 0.96  CALCIUM 9.1  PROT 6.9  BILITOT 0.2*  ALKPHOS 82  ALT 14  AST 15  GLUCOSE 98    Imaging/Diagnostic Tests: Ct Head Wo Contrast  Result Date: 02/17/2019 CLINICAL DATA:  Slurred speech left facial droop EXAM: CT HEAD WITHOUT CONTRAST TECHNIQUE: Contiguous axial images were obtained from the base of the skull through the vertex without intravenous contrast. COMPARISON:  None. FINDINGS: Brain: No acute territorial infarction, hemorrhage or intracranial mass. Chunky calcification left cranial vertex anteriorly, possible calcified meningioma. Minimal small vessel ischemic changes of the white matter. Normal ventricle size. Vascular: No hyperdense vessels. Scattered calcifications at the carotid siphon Skull: Normal. Negative for fracture or focal lesion. Sinuses/Orbits: No acute finding. Patchy mucosal thickening in the ethmoid sinuses. Other: None IMPRESSION: No CT evidence for acute intracranial abnormality. Minimal small vessel ischemic change of the white matter Electronically Signed   By: Jasmine PangKim  Fujinaga M.D.   On: 02/17/2019 19:54   Mr Brain Wo Contrast  Result Date: 02/18/2019 CLINICAL DATA:  63 y/o F; episode of arm weakness, facial droop, and slurred speech. EXAM: MRI HEAD WITHOUT CONTRAST TECHNIQUE: Multiplanar, multiecho pulse sequences of the brain and surrounding structures were obtained without intravenous contrast. COMPARISON:  02/17/2019 CT head. FINDINGS: Brain: Linear focus of reduced diffusion within the left mid corona radiata measuring 20 mm (series 7, image 48) compatible with acute/early subacute infarction. No associated hemorrhage or mass effect. No additional  focus of reduced  diffusion. Scattered punctate nonspecific T2 FLAIR hyperintensities in subcortical and periventricular white matter are compatible with mild chronic microvascular ischemic changes. Mild volume loss of the brain. No extra-axial collection, hydrocephalus, mass effect, or herniation. Vascular: Normal flow voids. Skull and upper cervical spine: Partially visualized advanced upper cervical spondylosis. No abnormal bone marrow signal. Sinuses/Orbits: Partial opacification of right posterior ethmoid air cells. Additional included paranasal sinuses and the mastoid air cells are normally aerated. Orbits are unremarkable. Other: None. IMPRESSION: 1. 2 cm linear acute/early subacute infarction within the left mid corona radiata. No associated hemorrhage or mass effect. 2. Mild chronic microvascular ischemic changes and volume loss of the brain. Electronically Signed   By: Kristine Garbe M.D.   On: 02/18/2019 00:23     Kathrene Alu, MD 02/18/2019, 6:23 AM PGY-2, [place] Intern pager: [phone], text pages welcome

## 2019-02-18 NOTE — Evaluation (Signed)
Occupational Therapy Evaluation Patient Details Name: Sheila CoverRobin D Larmer MRN: [id number] DOB: 01-03-1956 Today's Date: 02/18/2019    History of Present Illness Pt is a 63 y.o. female who presented with slurred speech and resolved left-sided facial droop with left arm weakness. Pending TIA vs CVA. PMH is significant for tobacco use, hypertension.   Clinical Impression   Pt admitted with above. Pt currently with functional limitations due to the deficits listed below (see OT Problem List). Patient lives with there sister in a Mobile Home with 8 steps to enter with railing on both sides. Patient was educated about use of grab bar in shower/tub unit. Patient at this time was able to ambulate small room distances without walker. Patient completed standing ADLS at sink level with no LOB. Patient tolerated session and had no further questions.       Follow Up Recommendations  No OT follow up;Supervision - Intermittent    Equipment Recommendations  (grab bars in shower)    Recommendations for Other Services Speech consult     Precautions / Restrictions Precautions Precautions: None Restrictions Weight Bearing Restrictions: No      Mobility Bed Mobility Overal bed mobility: Modified Independent(increase time)                Transfers Overall transfer level: Modified independent               General transfer comment: increase time    Balance                                           ADL either performed or assessed with clinical judgement   ADL Overall ADL's : Needs assistance/impaired Eating/Feeding: Independent;Sitting   Grooming: Wash/dry hands;Standing;Modified independent   Upper Body Bathing: Independent;Sitting;Standing   Lower Body Bathing: Modified independent;Sit to/from stand   Upper Body Dressing : Independent;Sitting;Standing   Lower Body Dressing: Cueing for safety;Supervision/safety   Toilet Transfer: Supervision/safety;Cueing  for safety;Cueing for sequencing   Toileting- Clothing Manipulation and Hygiene: Modified independent;Sit to/from stand   Tub/ Shower Transfer: Modified independent;Grab bars   Functional mobility during ADLs: Supervision/safety;Cueing for safety;Cueing for sequencing(room distance)       Vision Baseline Vision/History: Wears glasses Wears Glasses: At all times Patient Visual Report: No change from baseline Vision Assessment?: No apparent visual deficits     Perception Perception Perception Tested?: No   Praxis Praxis Praxis tested?: Not tested    Pertinent Vitals/Pain Pain Assessment: No/denies pain     Hand Dominance Right   Extremity/Trunk Assessment Upper Extremity Assessment Upper Extremity Assessment: Overall WFL for tasks assessed   Lower Extremity Assessment Lower Extremity Assessment: Defer to PT evaluation   Cervical / Trunk Assessment Cervical / Trunk Assessment: Normal   Communication Communication Communication: Other (comment)(slow)   Cognition Arousal/Alertness: Awake/alert Behavior During Therapy: WFL for tasks assessed/performed Overall Cognitive Status: Within Functional Limits for tasks assessed                                     General Comments       Exercises     Shoulder Instructions      Home Living Family/patient expects to be discharged to:: Private residence Living Arrangements: Other relatives Available Help at Discharge: (sister) Type of Home: Mobile home Home Access: Stairs to enter Entrance Stairs-Number  of Steps: 8 Entrance Stairs-Rails: Can reach both Home Layout: One level     Bathroom Shower/Tub: Teacher, early years/pre: Standard Bathroom Accessibility: Yes How Accessible: Accessible via walker Home Equipment: Toilet riser          Prior Functioning/Environment Level of Independence: Independent                 OT Problem List: Decreased strength;Decreased range of  motion;Decreased activity tolerance;Decreased coordination;Decreased safety awareness;Decreased knowledge of use of DME or AE;Pain;Impaired UE functional use      OT Treatment/Interventions:      OT Goals(Current goals can be found in the care plan section) Acute Rehab OT Goals Patient Stated Goal: to go home OT Goal Formulation: With patient Time For Goal Achievement: 02/25/19 Potential to Achieve Goals: Good  OT Frequency:     Barriers to D/C:            Co-evaluation              AM-PAC OT "6 Clicks" Daily Activity     Outcome Measure Help from another person eating meals?: None Help from another person taking care of personal grooming?: None Help from another person toileting, which includes using toliet, bedpan, or urinal?: None Help from another person bathing (including washing, rinsing, drying)?: A Little Help from another person to put on and taking off regular upper body clothing?: None Help from another person to put on and taking off regular lower body clothing?: A Little 6 Click Score: 22   End of Session Equipment Utilized During Treatment: Gait belt;Rolling walker  Activity Tolerance: Patient tolerated treatment well Patient left: in bed;Other (comment);with bed alarm set  OT Visit Diagnosis: Unsteadiness on feet (R26.81);Repeated falls (R29.6);Muscle weakness (generalized) (M62.81);Pain                Time: 4332-9518 OT Time Calculation (min): 30 min Charges:  OT General Charges $OT Visit: 1 Visit OT Evaluation $OT Eval Low Complexity: 1 Low OT Treatments $Self Care/Home Management : 8-22 mins  Joeseph Amor OTR/L  Acute Rehab Services  [phone] office number [phone] pager number   Joeseph Amor 02/18/2019, 8:28 AM

## 2019-02-18 NOTE — Discharge Summary (Signed)
Sheila Webster Discharge Summary  Patient name: Sheila Webster Medical record number: [id number] Date of birth: March 21, 1956 Age: 63 y.o. Gender: female Date of Admission: 02/17/2019  Date of Discharge: 02/19/2019 Admitting Physician: Sheila Feil, MD  Primary Care Provider: No Pcp, Per Patient (Inactive) Consultants: Neurology  Indication for Hospitalization: Stroke  Discharge Diagnoses/Problem List:  Left mid corona radiata infarction Tobacco use HTN Isolated bradycardia   Disposition: home   Discharge Condition: stable  Discharge Exam:  General: awake and alert, laying in bed, NAD  Cardiovascular: RRR, no MRG Respiratory: CTAB, no wheezes, rales, or rhonchi  Abdomen: soft, non tender, non distended, bowel sounds normal  Extremities: no edema, non tender, 5/5 muscle strength in all extremities, normal grip strength  Neuro: no focal deficits, CN2-12 intact, sensation intact, no finger to nose dysmetria   Brief Webster Course:  Sheila Webster is a 63 y.o. female with past medical history significant for tobacco use and hypertension, who presented with slurred speech and resolved left-sided facial droop with left arm weakness and found to have a left mid corona radiata infarction. See H&P for HPI details. At time of presentation patient was back to baseline with no focal neurologic deficits. Last known normal was 6/19. Initial work up as significant for CT head negative for acute abnormality and minimal chronic small vessel ischemic changes. Subsequent MRI revealed a 2 cm linear acute/early subacute infarction within the left mid corona radiata. No associated hemorrhage or mass effect. Mild chronic microvascular ischemic changes and volume loss of the brain. Patient was started on ASA and statin on admission with permissive hypertension. Neurology was consulted and further stroke work up initiated. Echo, Carotid doppler, transcranial doppler were negative for  contribution to stroke. PT and OT worked with patient with no further recommendations. She was discharged on duel antiplatelet therapy and statin. At time of discharge, patient was stable and at baseline. Return precautions discussed and close follow up arranged.   Issues for Follow Up:  1. Consider HTN management 2. Consider outpatient holter for bradycardic episodes 3. Recommended by neurology for ASA + plavix x3 weeks followed by ASA alone 4. Ensure continued statin use   Significant Procedures: None  Significant Labs and Imaging:  Recent Labs  Lab 02/17/19 1955 02/18/19 1123  WBC 11.6* 9.3  HGB 12.8 11.6*  HCT 42.6 37.9  PLT 363 316   Recent Labs  Lab 02/17/19 1955 02/18/19 1123  NA 142 139  K 4.5 3.7  CL 105 108  CO2 26 24  GLUCOSE 98 123*  BUN 16 13  CREATININE 0.96 0.66  CALCIUM 9.1 8.4*  ALKPHOS 82  --   AST 15  --   ALT 14  --   ALBUMIN 3.4*  --     HIV neg TSH 0.612  Lipid Panel     Component Value Date/Time   CHOL 218 (H) 02/18/2019 0332   TRIG 86 02/18/2019 0332   HDL 49 02/18/2019 0332   CHOLHDL 4.4 02/18/2019 0332   VLDL 17 02/18/2019 0332   LDLCALC 152 (H) 02/18/2019 0332   Urinalysis    Component Value Date/Time   COLORURINE YELLOW 11/01/2007 1648   APPEARANCEUR CLEAR 11/01/2007 1648   LABSPEC 1.010 11/01/2007 1648   PHURINE 6.5 11/01/2007 1648   GLUCOSEU NEGATIVE 11/01/2007 1648   HGBUR MODERATE (A) 11/01/2007 1648   BILIRUBINUR NEGATIVE 11/01/2007 1648   KETONESUR NEGATIVE 11/01/2007 1648   PROTEINUR NEGATIVE 11/01/2007 1648   UROBILINOGEN 1.0 11/01/2007  1648   NITRITE NEGATIVE 11/01/2007 1648   LEUKOCYTESUR NEGATIVE 11/01/2007 1648   ECHO: Normal LV systolic function; mild LVH; mild diastolic dysfunction; mildly dilated ascending aorta; trace AI.  Transcranial doppler:  +----------+-------------+----------+-----------+-------+ RIGHT TCD Right VM (cm)Depth  (cm)PulsatilityComment +----------+-------------+----------+-----------+-------+ PCA           22.00                 0.99            +----------+-------------+----------+-----------+-------+ Opthalmic     20.00                 1.17            +----------+-------------+----------+-----------+-------+ ICA siphon    44.00                 0.95            +----------+-------------+----------+-----------+-------+    +----------+------------+----------+-----------+-------+ LEFT TCD  Left VM (cm)Depth (cm)PulsatilityComment +----------+------------+----------+-----------+-------+ MCA          40.00                 1.28            +----------+------------+----------+-----------+-------+ Opthalmic    19.00                 1.17            +----------+------------+----------+-----------+-------+ ICA siphon   22.00                 1.11            +----------+------------+----------+-----------+-------+   Carotid Doppler  Right Carotid: Velocities in the right ICA are consistent with a 1-39% stenosis.  Left Carotid: Velocities in the left ICA are consistent with a 1-39% stenosis.  Vertebrals: Bilateral vertebral arteries demonstrate antegrade flow.  Ct Head Wo Contrast  Result Date: 02/17/2019 CLINICAL DATA:  Slurred speech left facial droop EXAM: CT HEAD WITHOUT CONTRAST TECHNIQUE: Contiguous axial images were obtained from the base of the skull through the vertex without intravenous contrast. COMPARISON:  None. FINDINGS: Brain: No acute territorial infarction, hemorrhage or intracranial mass. Chunky calcification left cranial vertex anteriorly, possible calcified meningioma. Minimal small vessel ischemic changes of the white matter. Normal ventricle size. Vascular: No hyperdense vessels. Scattered calcifications at the carotid siphon Skull: Normal. Negative for fracture or focal lesion. Sinuses/Orbits: No acute finding. Patchy mucosal thickening in  the ethmoid sinuses. Other: None IMPRESSION: No CT evidence for acute intracranial abnormality. Minimal small vessel ischemic change of the white matter Electronically Signed   By: Jasmine PangKim  Fujinaga M.D.   On: 02/17/2019 19:54   Mr Brain Wo Contrast  Result Date: 02/18/2019 CLINICAL DATA:  63 y/o F; episode of arm weakness, facial droop, and slurred speech. EXAM: MRI HEAD WITHOUT CONTRAST TECHNIQUE: Multiplanar, multiecho pulse sequences of the brain and surrounding structures were obtained without intravenous contrast. COMPARISON:  02/17/2019 CT head. FINDINGS: Brain: Linear focus of reduced diffusion within the left mid corona radiata measuring 20 mm (series 7, image 48) compatible with acute/early subacute infarction. No associated hemorrhage or mass effect. No additional focus of reduced diffusion. Scattered punctate nonspecific T2 FLAIR hyperintensities in subcortical and periventricular white matter are compatible with mild chronic microvascular ischemic changes. Mild volume loss of the brain. No extra-axial collection, hydrocephalus, mass effect, or herniation. Vascular: Normal flow voids. Skull and upper cervical spine: Partially visualized advanced upper cervical spondylosis. No abnormal bone marrow signal. Sinuses/Orbits: Partial opacification of right posterior  ethmoid air cells. Additional included paranasal sinuses and the mastoid air cells are normally aerated. Orbits are unremarkable. Other: None. IMPRESSION: 1. 2 cm linear acute/early subacute infarction within the left mid corona radiata. No associated hemorrhage or mass effect. 2. Mild chronic microvascular ischemic changes and volume loss of the brain. Electronically Signed   By: Mitzi HansenLance  Furusawa-Stratton M.D.   On: 02/18/2019 00:23   [facility] [place] [facility]id [facility]  Result Date: 02/19/2019 Carotid Arterial Duplex Study Indications:       CVA. Risk Factors:      Hypertension. Comparison Study:  no prior Performing Technologist: Blanch MediaMegan Riddle  RVS  Examination Guidelines: A complete evaluation includes B-mode imaging, spectral Doppler, color Doppler, and power Doppler as needed of all accessible portions of each vessel. Bilateral testing is considered an integral part of a complete examination. Limited examinations for reoccurring indications may be performed as noted.  Right Carotid Findings: +----------+--------+--------+--------+-----------+--------+           PSV cm/sEDV cm/sStenosisDescribe   Comments +----------+--------+--------+--------+-----------+--------+ CCA Prox  53      11              homogeneous         +----------+--------+--------+--------+-----------+--------+ CCA Distal53      20              homogeneous         +----------+--------+--------+--------+-----------+--------+ ICA Prox  57      22      1-39%   homogeneous         +----------+--------+--------+--------+-----------+--------+ ICA Distal64      20                                  +----------+--------+--------+--------+-----------+--------+ ECA       48      14                                  +----------+--------+--------+--------+-----------+--------+ +----------+--------+-------+--------+-------------------+           PSV cm/sEDV cmsDescribeArm Pressure (mmHG) +----------+--------+-------+--------+-------------------+ [id number]                                         +----------+--------+-------+--------+-------------------+ +---------+--------+--+--------+--+---------+ VertebralPSV cm/s39EDV cm/s10Antegrade +---------+--------+--+--------+--+---------+  Left Carotid Findings: +----------+--------+--------+--------+-----------+--------+           PSV cm/sEDV cm/sStenosisDescribe   Comments +----------+--------+--------+--------+-----------+--------+ CCA Prox  65      16              homogeneous         +----------+--------+--------+--------+-----------+--------+ CCA Distal52      16               homogeneous         +----------+--------+--------+--------+-----------+--------+ ICA Prox  83      27      1-39%   homogeneoustortuous +----------+--------+--------+--------+-----------+--------+ ICA Distal90      31                                  +----------+--------+--------+--------+-----------+--------+ ECA       39      8                                   +----------+--------+--------+--------+-----------+--------+ +----------+--------+--------+--------+-------------------+  SubclavianPSV cm/sEDV cm/sDescribeArm Pressure (mmHG) +----------+--------+--------+--------+-------------------+           80                                          +----------+--------+--------+--------+-------------------+ +---------+--------+--+--------+--+---------+ VertebralPSV cm/s77EDV cm/s27Antegrade +---------+--------+--+--------+--+---------+  Summary: Right Carotid: Velocities in the right ICA are consistent with a 1-39% stenosis. Left Carotid: Velocities in the left ICA are consistent with a 1-39% stenosis. Vertebrals: Bilateral vertebral arteries demonstrate antegrade flow. *See table(s) above for measurements and observations.     Preliminary    Vas [place] Transcranial Doppler  Result Date: 02/19/2019  Transcranial Doppler Indications: Stroke. History: HTN. Comparison Study: NO PRIOR Performing Technologist: Blanch Media RVS  Examination Guidelines: A complete evaluation includes B-mode imaging, spectral Doppler, color Doppler, and power Doppler as needed of all accessible portions of each vessel. Bilateral testing is considered an integral part of a complete examination. Limited examinations for reoccurring indications may be performed as noted.  +----------+-------------+----------+-----------+-------+ RIGHT TCD Right VM (cm)Depth (cm)PulsatilityComment +----------+-------------+----------+-----------+-------+ PCA           22.00                 0.99             +----------+-------------+----------+-----------+-------+ Opthalmic     20.00                 1.17            +----------+-------------+----------+-----------+-------+ ICA siphon    44.00                 0.95            +----------+-------------+----------+-----------+-------+  +----------+------------+----------+-----------+-------+ LEFT TCD  Left VM (cm)Depth (cm)PulsatilityComment +----------+------------+----------+-----------+-------+ MCA          40.00                 1.28            +----------+------------+----------+-----------+-------+ Opthalmic    19.00                 1.17            +----------+------------+----------+-----------+-------+ ICA siphon   22.00                 1.11            +----------+------------+----------+-----------+-------+     Preliminary    Results/Tests Pending at Time of Discharge:  Unresulted Labs (From admission, onward)    Start     Ordered   02/17/19 2201  Urine rapid drug screen (hosp performed)not at [facility]  Once,   STAT     02/17/19 2204   02/17/19 1927  Novel Coronavirus,NAA,(SEND-OUT TO REF LAB - TAT 24-48 hrs); Hosp Order  (Asymptomatic Patients Labs)  Once,   STAT    Question:  Rule Out  Answer:  Yes   02/17/19 1926   02/17/19 1926  Urinalysis, Routine w reflex microscopic  ONCE - STAT,   STAT     02/17/19 1925           Discharge Medications:  Allergies as of 02/19/2019   No Known Allergies     Medication List    STOP taking these medications   ibuprofen 200 MG tablet Commonly known as: ADVIL     TAKE these medications    stroke: mapping our early stages of  recovery book Misc 1 each by Does not apply route once for 1 dose.   APPLE CIDER VINEGAR PO Take 450 mg by mouth 3 (three) times daily.   aspirin 81 MG chewable tablet Chew 1 tablet (81 mg total) by mouth daily. Start taking on: February 20, 2019   atorvastatin 80 MG tablet Commonly known as: LIPITOR Take 1 tablet (80 mg total) by mouth daily  at 6 PM.   clopidogrel 75 MG tablet Commonly known as: PLAVIX Take 1 tablet (75 mg total) by mouth daily. Start taking on: February 20, 2019       Discharge Instructions: Please refer to Patient Instructions section of EMR for full details.  Patient was counseled important signs and symptoms that should prompt return to medical care, changes in medications, dietary instructions, activity restrictions, and follow up appointments.   Follow-Up Appointments: Patient given list of PCPs to choose from and will make follow up appointment at her own leisure.   Garnette Gunnerhompson, Aaron B, MD 02/19/2019, 3:48 PM PGY-2, [facility]

## 2019-02-18 NOTE — Evaluation (Signed)
Physical Therapy Evaluation/Discharge Patient Details Name: Sheila Webster MRN: [id number] DOB: August 23, 1956 Today's Date: 02/18/2019   History of Present Illness  Pt is a 63 y.o. female who presented with slurred speech and resolved left-sided facial droop with left arm weakness. Pending TIA vs CVA. PMH is significant for tobacco use, hypertension.  Clinical Impression  Despite acute CVA, pt is mobilizing well with her normal gait pattern.  No significant signs of asymmetry in her LEs.  She was able to preform stairs simulating home entry with use of the rail for support without difficulty.  She reports her speech (per her family) continues to be a bit slurred, and her R hand is a bit weak.  No further PT acutely or follow up needed.  PT to sign off.     Follow Up Recommendations No PT follow up    Equipment Recommendations  None recommended by PT    Recommendations for Other Services   NA    Precautions / Restrictions Precautions Precautions: None Restrictions Weight Bearing Restrictions: No      Mobility  Bed Mobility Overal bed mobility: Modified Independent(increase time)                Transfers Overall transfer level: Modified independent               General transfer comment: increase time  Ambulation/Gait Ambulation/Gait assistance: Supervision Gait Distance (Feet): 300 Feet Assistive device: None Gait Pattern/deviations: WFL(Within Functional Limits)     General Gait Details: Pt with mildly unsteady gait initially, but she reports this is normal when she first gets up she has to "work out the kinks" and is a bit "stiff".  Which proved to be true as her gait normalized the further we went.   Stairs Stairs: Yes Stairs assistance: Supervision Stair Management: One rail Right;Step to pattern;Forwards Number of Stairs: 4(limited by IV line) General stair comments: Pt ascended and descended stairs with step to pattern and use of the railing without  difficulty.  I am confident she can do her 8 to enter her home safely      Modified Rankin (Stroke Patients Only) Modified Rankin (Stroke Patients Only) Pre-Morbid Rankin Score: No symptoms Modified Rankin: Moderately severe disability     Balance Overall balance assessment: Needs assistance Sitting-balance support: Feet supported Sitting balance-Leahy Scale: Normal     Standing balance support: No upper extremity supported Standing balance-Leahy Scale: Good                               Pertinent Vitals/Pain Pain Assessment: No/denies pain    Home Living Family/patient expects to be discharged to:: Private residence Living Arrangements: Other relatives Available Help at Discharge: Family;Available PRN/intermittently(sister) Type of Home: Mobile home Home Access: Stairs to enter Entrance Stairs-Rails: Can reach both Entrance Stairs-Number of Steps: 8 Home Layout: One level Home Equipment: Toilet riser      Prior Function Level of Independence: Independent         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, newspaper   Dominant Hand: Right    Extremity/Trunk Assessment   Upper Extremity Assessment Upper Extremity Assessment: Defer to OT evaluation    Lower Extremity Assessment Lower Extremity Assessment: Overall WFL for tasks assessed    Cervical / Trunk Assessment Cervical / Trunk Assessment: Normal  Communication   Communication: Other (comment)(slow)  Cognition Arousal/Alertness: Awake/alert Behavior During Therapy: WFL for tasks assessed/performed Overall Cognitive  Status: Within Functional Limits for tasks assessed                                               Assessment/Plan    PT Assessment Patent does not need any further PT services         PT Goals (Current goals can be found in the Care Plan section)  Acute Rehab PT Goals Patient Stated Goal: to go home PT Goal Formulation: All assessment and education  complete, DC therapy               AM-PAC PT "6 Clicks" Mobility  Outcome Measure Help needed turning from your back to your side while in a flat bed without using bedrails?: None Help needed moving from lying on your back to sitting on the side of a flat bed without using bedrails?: None Help needed moving to and from a bed to a chair (including a wheelchair)?: None Help needed standing up from a chair using your arms (e.g., wheelchair or bedside chair)?: None Help needed to walk in hospital room?: None Help needed climbing 3-5 steps with a railing? : None 6 Click Score: 24    End of Session   Activity Tolerance: Patient tolerated treatment well Patient left: in bed;Other (comment)(seated EOB working with HydrologistN tech on bath)   PT Visit Diagnosis: Other symptoms and signs involving the nervous system (G95.621(R29.898)    Time: [id number] PT Time Calculation (min) (ACUTE ONLY): 11 min   Charges:      Lurena Joinerebecca B. Grainne Knights, PT, DPT  Acute Rehabilitation [phone] pager [phone] office  @ [facility] [place]: [phone]   PT Evaluation $PT Eval Moderate Complexity: 1 Mod        02/18/2019, 1:41 PM

## 2019-02-18 NOTE — Progress Notes (Signed)
STROKE TEAM PROGRESS NOTE   HISTORY OF PRESENT ILLNESS (per Dr Otelia LimesLindzen) Leanora CoverRobin D Goodridge is an 63 y.o. female who presented to the ED with slurred speech, LUE weakness and left sided facial droop. LKN was Friday night. She woke up on Saturday morning with the symptoms. She was ambulatory in Triage with no weakness. She did not complain of any headaches, SOB, CP or abdominal pain. CT head showed no acute abnormality; minimal chronic small vessel ischemic changes were noted.  At time of Neurology consultation, she denies headache, vision changes, confusion, dysphasia, CP, SOB, abdominal pain, limb weakness or limb numbness.     CT head: No CT evidence for acute intracranial abnormality. Minimal small vessel ischemic change of the white matter    SUBJECTIVE (INTERVAL HISTORY) .  I have personally obtained history of presenting illness with the patient and reviewed electronic medical records and imaging films in PACS.  Patient states that her daughter never noticed facial weakness and she is not sure whether it is the left to the right side.  She denies any prior history of strokes or TIAs.  MRI scan shows nearly 2 cm left corona radiata infarct.    OBJECTIVE Vitals:   02/17/19 2315 02/17/19 2317 02/18/19 0203 02/18/19 0611  BP: (!) 145/114 (!) 155/128 130/89 102/89  Pulse: 75 82 72 71  Resp: 18  16 16   Temp: 98.3 F (36.8 C)  98.3 F (36.8 C) 98.1 F (36.7 C)  TempSrc: Oral  Oral Oral  SpO2: 100% 99% 100% 98%  Weight:      Height:        CBC:  Recent Labs  Lab 02/17/19 1955  WBC 11.6*  NEUTROABS 7.8*  HGB 12.8  HCT 42.6  MCV 84.5  PLT 363    Basic Metabolic Panel:  Recent Labs  Lab 02/17/19 1955  NA 142  K 4.5  CL 105  CO2 26  GLUCOSE 98  BUN 16  CREATININE 0.96  CALCIUM 9.1    Lipid Panel:     Component Value Date/Time   CHOL 218 (H) 02/18/2019 0332   TRIG 86 02/18/2019 0332   HDL 49 02/18/2019 0332   CHOLHDL 4.4 02/18/2019 0332   VLDL 17 02/18/2019  0332   LDLCALC 152 (H) 02/18/2019 0332   HgbA1c: No results found for: HGBA1C Urine Drug Screen: No results found for: LABOPIA, COCAINSCRNUR, LABBENZ, AMPHETMU, THCU, LABBARB  Alcohol Level No results found for: ETH  IMAGING  Ct Head Wo Contrast 02/17/2019 IMPRESSION:  No CT evidence for acute intracranial abnormality. Minimal small vessel ischemic change of the white matter.  Mr Brain Wo Contrast 02/18/2019 IMPRESSION:  1. 2 cm linear acute/early subacute infarction within the left mid corona radiata. No associated hemorrhage or mass effect.  2. Mild chronic microvascular ischemic changes and volume loss of the brain.    Transthoracic Echocardiogram  00/00/2020 Pending    Bilateral Carotid Dopplers  00/00/2020 Pending   EKG - SR rate 99 BPM. (See cardiology reading for complete details)    PHYSICAL EXAM Blood pressure 102/89, pulse 71, temperature 98.1 F (36.7 C), temperature source Oral, resp. rate 16, height 5\' 4"  (1.626 m), weight 97.5 kg, SpO2 98 %. Pleasant middle-aged African-American lady currently not in distress. . Afebrile. Head is nontraumatic. Neck is supple without bruit.    Cardiac exam no murmur or gallop. Lungs are clear to auscultation. Distal pulses are well felt. Neurological Exam : Awake alert oriented x 3 normal speech and language.  Extraocular movements are full range without nystagmus.  Blinks to threat bilaterally.  Mild right lower face asymmetry. Tongue midline.  Mild right upper extremity drift. Mild diminished fine finger movements on right with right grip weakness. Orbits left over right upper extremity.  Symmetric strength in both lower extremities.  Normal sensation . Normal coordination.  Gait deferred          ASSESSMENT/PLAN Ms. JOELLA SAEFONG is a 63 y.o. female with history of tobacco use, obesity and hypertension presenting with slurred speech, LUE weakness and left sided facial droop.. She did not receive IV t-PA due to late  presentation (>4.5 hours from time of onset).  Stroke:  subacute infarction within the left mid corona radiata - small vessel disease  Resultant mild right face and hand weakness  CT head  - no acute findings.  MRI head - 2 cm linear acute/early subacute infarction within the left mid corona radiata.  MRA head - not performed  CTA H&N - not performed  Transcranial dopplers - pending  Carotid Doppler - pending  2D Echo - pending  Sars Corona Virus 2 - pending  LDL - 152  HgbA1c - pending  UDS - pending  VTE prophylaxis - Lovenox  Diet  - Heart healthy with thin liquids.  No antithrombotic prior to admission, now on aspirin 81 mg daily  Patient counseled to be compliant with her antithrombotic medications  Ongoing aggressive stroke risk factor management  Therapy recommendations:  pending  Disposition:  Pending  Hypertension  Blood pressure somewhat high at times but within post stroke parameters . Permissive hypertension (OK if < 220/120) but gradually normalize in 5-7 days . Long-term BP goal normotensive  Hyperlipidemia  Lipid lowering medication PTA:  none  LDL 152, goal < 70  Current lipid lowering medication: Lipitor 80 mg daily  Continue statin at discharge   Other Stroke Risk Factors  Advanced age  Cigarette smoker - advised to stop smoking  Obesity, Body mass index is 36.9 kg/m., recommend weight loss, diet and exercise as appropriate   Other Active Problems  Mild leukocytosis - 11.6 (afebrile)     Hospital day # 0  I have personally obtained history,examined this patient, reviewed notes, independently viewed imaging studies, participated in medical decision making and plan of care.ROS completed by me personally and pertinent positives fully documented  I have made any additions or clarifications directly to the above note.  She presented with right facial and hand weakness secondary to large left subcortical infarct etiology  probably small vessel disease.  Recommend aspirin and Plavix for 3 weeks followed by aspirin alone.  Continue ongoing stroke work-up.  Add statin for elevated lipids.  Aggressive risk factor modification.  Long discussion with patient and with Dr. Tawanna Solo and answered questions.  Greater than 50% time during this 35-minute visit was spent on counseling and coordination of care about her stroke and discussion about prevention and treatment and answering questions.  Antony Contras, MD Medical Director [facility] Pager: [phone] 02/18/2019 12:51 PM   To contact Stroke Continuity provider, please refer to [email or web address] After hours, contact General Neurology

## 2019-02-19 ENCOUNTER — Inpatient Hospital Stay (HOSPITAL_COMMUNITY): Payer: Self-pay

## 2019-02-19 DIAGNOSIS — I639 Cerebral infarction, unspecified: Secondary | ICD-10-CM

## 2019-02-19 LAB — SARS CORONAVIRUS 2 BY RT PCR (HOSPITAL ORDER, PERFORMED IN ~~LOC~~ HOSPITAL LAB): SARS Coronavirus 2: NEGATIVE

## 2019-02-19 LAB — ECHOCARDIOGRAM COMPLETE
Height: 64 in
Weight: 3425.07 oz

## 2019-02-19 LAB — HEMOGLOBIN A1C
Hgb A1c MFr Bld: 5.9 % — ABNORMAL HIGH (ref 4.8–5.6)
Mean Plasma Glucose: 123 mg/dL

## 2019-02-19 MED ORDER — CLOPIDOGREL BISULFATE 75 MG PO TABS: 75.0000 mg | ORAL_TABLET | Freq: Every day | ORAL | 0 refills | Status: DC

## 2019-02-19 MED ORDER — POTASSIUM CHLORIDE CRYS ER 20 MEQ PO TBCR
40.0000 meq | EXTENDED_RELEASE_TABLET | Freq: Once | ORAL | Status: AC
  Administered 2019-02-19: 40 meq via ORAL
  Filled 2019-02-19: qty 2

## 2019-02-19 MED ORDER — ATORVASTATIN CALCIUM 80 MG PO TABS: 80.0000 mg | ORAL_TABLET | Freq: Every day | ORAL | 11 refills | Status: DC

## 2019-02-19 MED ORDER — CLOPIDOGREL BISULFATE 75 MG PO TABS
75.0000 mg | ORAL_TABLET | Freq: Every day | ORAL | Status: DC
  Administered 2019-02-19: 75 mg via ORAL
  Filled 2019-02-19: qty 1

## 2019-02-19 MED ORDER — STROKE: EARLY STAGES OF RECOVERY BOOK: 1.0000 | Freq: Once | 0 refills | Status: AC

## 2019-02-19 MED ORDER — ASPIRIN 81 MG PO CHEW: 81.0000 mg | CHEWABLE_TABLET | Freq: Every day | ORAL | 11 refills | Status: DC

## 2019-02-19 MED FILL — ASPIRIN LOW DOSE 81 MG CHEW: 81 | 30 days supply | Qty: 30 | Fill #0

## 2019-02-19 MED FILL — CLOPIDOGREL 75 MG TABLET: 75 | 21 days supply | Qty: 21 | Fill #0

## 2019-02-19 MED FILL — ATORVASTATIN CALCIUM 80 MG: 80 | 30 days supply | Qty: 30 | Fill #0

## 2019-02-19 NOTE — Progress Notes (Signed)
STROKE TEAM PROGRESS NOTE  Subjective :  Patient states he is neurologically improved.  Stroke work-up was completed.  Is ready to be discharged home today.   OBJECTIVE Vitals:   02/19/19 0020 02/19/19 0412 02/19/19 0835 02/19/19 1140  BP: 107/81 130/76 140/83 (!) 150/94  Pulse: 64 62 (!) 50 71  Resp: 16 14  16   Temp: 98.6 F (37 C) 98.6 F (37 C) 97.8 F (36.6 C) (!) 97.5 F (36.4 C)  TempSrc: Oral Oral Oral Oral  SpO2: 100% 100% 92% 100%  Weight:  97.1 kg    Height:        CBC:  Recent Labs  Lab 02/17/19 1955 02/18/19 1123  WBC 11.6* 9.3  NEUTROABS 7.8*  --   HGB 12.8 11.6*  HCT 42.6 37.9  MCV 84.5 83.5  PLT 363 316    Basic Metabolic Panel:  Recent Labs  Lab 02/17/19 1955 02/18/19 1123  NA 142 139  K 4.5 3.7  CL 105 108  CO2 26 24  GLUCOSE 98 123*  BUN 16 13  CREATININE 0.96 0.66  CALCIUM 9.1 8.4*    Lipid Panel:     Component Value Date/Time   CHOL 218 (H) 02/18/2019 0332   TRIG 86 02/18/2019 0332   HDL 49 02/18/2019 0332   CHOLHDL 4.4 02/18/2019 0332   VLDL 17 02/18/2019 0332   LDLCALC 152 (H) 02/18/2019 0332   HgbA1c:  Lab Results  Component Value Date   HGBA1C 5.9 (H) 02/17/2019   Urine Drug Screen: No results found for: LABOPIA, COCAINSCRNUR, LABBENZ, AMPHETMU, THCU, LABBARB  Alcohol Level No results found for: ETH  IMAGING  Ct Head Wo Contrast 02/17/2019 IMPRESSION:  No CT evidence for acute intracranial abnormality. Minimal small vessel ischemic change of the white matter.  Mr Brain Wo Contrast 02/18/2019 IMPRESSION:  1. 2 cm linear acute/early subacute infarction within the left mid corona radiata. No associated hemorrhage or mass effect.  2. Mild chronic microvascular ischemic changes and volume loss of the brain.    Transthoracic Echocardiogram  Normal ejection fraction.  No cardiac source of embolism.    Bilateral Carotid Dopplers  1-39% bilateral carotid stenosis.   EKG - SR rate 99 BPM. (See cardiology reading  for complete details)    PHYSICAL EXAM Blood pressure (!) 150/94, pulse 71, temperature (!) 97.5 F (36.4 C), temperature source Oral, resp. rate 16, height 5\' 4"  (1.626 m), weight 97.1 kg, SpO2 100 %. Pleasant middle-aged African-American lady currently not in distress. . Afebrile. Head is nontraumatic. Neck is supple without bruit.    Cardiac exam no murmur or gallop. Lungs are clear to auscultation. Distal pulses are well felt. Neurological Exam : Awake alert oriented x 3 normal speech and language.  Extraocular movements are full range without nystagmus.  Blinks to threat bilaterally.  Mild right lower face asymmetry. Tongue midline.  Mild right upper extremity drift. Mild diminished fine finger movements on right with right grip weakness. Orbits left over right upper extremity.  Symmetric strength in both lower extremities.  Normal sensation . Normal coordination.  Gait deferred          ASSESSMENT/PLAN Ms. Leanora CoverRobin D Wollenberg is a 63 y.o. female with history of tobacco use, obesity and hypertension presenting with slurred speech, LUE weakness and left sided facial droop.. She did not receive IV t-PA due to late presentation (>4.5 hours from time of onset).  Stroke:  subacute infarction within the left mid corona radiata - small vessel disease  Resultant mild right face and hand weakness  CT head  - no acute findings.  MRI head - 2 cm linear acute/early subacute infarction within the left mid corona radiata.  MRA head - not performed  CTA H&N - not performed  Transcranial dopplers -absent bitemporal windows limit evaluation of anterior circulation.  Carotid Doppler -1-39% bilateral carotid stenosis.  2D Echo -normal ejection fraction.  No cardiac source of embolism.    Sars Corona Virus 2 -negative   LDL - 152  HgbA1c - pending  UDS - pending  VTE prophylaxis - Lovenox  Diet  - Heart healthy with thin liquids.  No antithrombotic prior to admission, now on aspirin  81 mg daily  Patient counseled to be compliant with her antithrombotic medications  Ongoing aggressive stroke risk factor management  Therapy recommendations:  pending  Disposition:  Pending  Hypertension  Blood pressure somewhat high at times but within post stroke parameters . Permissive hypertension (OK if < 220/120) but gradually normalize in 5-7 days . Long-term BP goal normotensive  Hyperlipidemia  Lipid lowering medication PTA:  none  LDL 152, goal < 70  Current lipid lowering medication: Lipitor 80 mg daily  Continue statin at discharge   Other Stroke Risk Factors  Advanced age  Cigarette smoker - advised to stop smoking  Obesity, Body mass index is 36.74 kg/m., recommend weight loss, diet and exercise as appropriate   Other Active Problems  Mild leukocytosis - 11.6 (afebrile)     Hospital day # 1    Recommend aspirin and Plavix for 3 weeks followed by aspirin alone.  Continue ongoing stroke work-up.  Continue statin for elevated lipids.  Aggressive risk factor modification.  Follow-up as an outpatient with stroke clinic in 6 weeks.  Stroke team will sign off.  Kindly call for questions. Antony Contras, MD Medical Director [facility] Pager: [phone] 02/19/2019 6:02 PM   To contact Stroke Continuity provider, please refer to [email or web address] After hours, contact General Neurology

## 2019-02-19 NOTE — Progress Notes (Signed)
Pt with isolated bradycardia episode to 32 while pt was asleep. Currently NSR with HR of 69  on telemetry  monitor . Family medicine night coverage MD notified

## 2019-02-19 NOTE — Progress Notes (Signed)
TCD and Carotid duplex has been completed.   Preliminary results in CV Proc.   Abram Sander 02/19/2019 2:54 PM

## 2019-02-19 NOTE — Progress Notes (Addendum)
Family Medicine Teaching Service Daily Progress Note Intern Pager: [phone]  Patient name: Sheila Webster Medical record number: [id number] Date of birth: 06/02/1956 Age: 63 y.o. Gender: female  Primary Care Provider: No Pcp, Per Patient (Inactive) Consultants: Neurology  Code Status: Full   Pt Overview and Major Events to Date:  Admitted to FPTS 6/20 MRI positive for infarct 6/21  Assessment and Plan: Left mid corona radiata infarction:  Found on MRI 6/21. Likely responsible for her left upper extremity weakness, left-sided facial droop, and slurred speech. Awaiting further risk stratification labs and imaging. W/u pending including echo, carotid dopp, transcranial dopp.  -Appreciate neurology recommendations: recommend DAPT and continue neuro w/u -Follow-up hemoglobin A1c, echo, carotid ultrasound -SLP, PT, OT: no f/u recommended  -continue atorvastatin 80 mg -continue ASA 81 mg -Smoking cessation counseling -Permissive hypertension -Follow-up case management consult  Tobacco use: Patient endorses smoking about 1/4 pack/day. She declines a nicotine patch at this time. -Patient counseled on the tobacco cessation -Nicotine patch on patient request  Hypertension:  Normotensive since admission, last BP 130/76 -Permissive hypertension -May benefit from antihypertensive on discharge  Isolated bradycardia Episode of bradycardia to 32 ON. Asymptomatic and NSR. Current HR wnl.  -continue to monitor on telemetry  -can consider outpatient holter per PCP  FEN/GI: Heart healthy  PPx: Lovenox  Disposition: dc home after neuro w/u   Subjective:  Patient states she feels well, hopeful to go home. Patient states she has had no cravings for cigarettes during admission and understands the importance of quitting.   Objective: Temp:  [97.8 F (36.6 C)-98.6 F (37 C)] 97.8 F (36.6 C) (06/22 0835) Pulse Rate:  [50-64] 50 (06/22 0835) Resp:  [14-16] 14 (06/22 0412) BP:  (107-140)/(76-83) 140/83 (06/22 0835) SpO2:  [92 %-100 %] 92 % (06/22 0835) Weight:  [97.1 kg] 97.1 kg (06/22 0412) Physical Exam: General: awake and alert, laying in bed, NAD  Cardiovascular: RRR, no MRG Respiratory: CTAB, no wheezes, rales, or rhonchi  Abdomen: soft, non tender, non distended, bowel sounds normal  Extremities: no edema, non tender, 5/5 muscle strength in all extremities, normal grip strength  Neuro: no focal deficits, CN2-12 intact, sensation intact, no finger to nose dysmetria   Laboratory: Recent Labs  Lab 02/17/19 1955 02/18/19 1123  WBC 11.6* 9.3  HGB 12.8 11.6*  HCT 42.6 37.9  PLT 363 316   Recent Labs  Lab 02/17/19 1955 02/18/19 1123  NA 142 139  K 4.5 3.7  CL 105 108  CO2 26 24  BUN 16 13  CREATININE 0.96 0.66  CALCIUM 9.1 8.4*  PROT 6.9  --   BILITOT 0.2*  --   ALKPHOS 82  --   ALT 14  --   AST 15  --   GLUCOSE 98 123*     Imaging/Diagnostic Tests: Ct Head Wo Contrast  Result Date: 02/17/2019 CLINICAL DATA:  Slurred speech left facial droop EXAM: CT HEAD WITHOUT CONTRAST TECHNIQUE: Contiguous axial images were obtained from the base of the skull through the vertex without intravenous contrast. COMPARISON:  None. FINDINGS: Brain: No acute territorial infarction, hemorrhage or intracranial mass. Chunky calcification left cranial vertex anteriorly, possible calcified meningioma. Minimal small vessel ischemic changes of the white matter. Normal ventricle size. Vascular: No hyperdense vessels. Scattered calcifications at the carotid siphon Skull: Normal. Negative for fracture or focal lesion. Sinuses/Orbits: No acute finding. Patchy mucosal thickening in the ethmoid sinuses. Other: None IMPRESSION: No CT evidence for acute intracranial abnormality. Minimal small vessel ischemic change  of the white matter Electronically Signed   By: Donavan Foil M.D.   On: 02/17/2019 19:54   Mr Brain Wo Contrast  Result Date: 02/18/2019 CLINICAL DATA:  63 y/o F;  episode of arm weakness, facial droop, and slurred speech. EXAM: MRI HEAD WITHOUT CONTRAST TECHNIQUE: Multiplanar, multiecho pulse sequences of the brain and surrounding structures were obtained without intravenous contrast. COMPARISON:  02/17/2019 CT head. FINDINGS: Brain: Linear focus of reduced diffusion within the left mid corona radiata measuring 20 mm (series 7, image 48) compatible with acute/early subacute infarction. No associated hemorrhage or mass effect. No additional focus of reduced diffusion. Scattered punctate nonspecific T2 FLAIR hyperintensities in subcortical and periventricular white matter are compatible with mild chronic microvascular ischemic changes. Mild volume loss of the brain. No extra-axial collection, hydrocephalus, mass effect, or herniation. Vascular: Normal flow voids. Skull and upper cervical spine: Partially visualized advanced upper cervical spondylosis. No abnormal bone marrow signal. Sinuses/Orbits: Partial opacification of right posterior ethmoid air cells. Additional included paranasal sinuses and the mastoid air cells are normally aerated. Orbits are unremarkable. Other: None. IMPRESSION: 1. 2 cm linear acute/early subacute infarction within the left mid corona radiata. No associated hemorrhage or mass effect. 2. Mild chronic microvascular ischemic changes and volume loss of the brain. Electronically Signed   By: Kristine Garbe M.D.   On: 02/18/2019 00:23     Caroline More, DO 02/19/2019, 8:58 AM PGY-2, [place] Intern pager: [phone], text pages welcome

## 2019-02-19 NOTE — Progress Notes (Signed)
Patient didn't want staff to call her family and inform them about her condition. She said she already talk with them.

## 2019-02-19 NOTE — Progress Notes (Signed)
  Echocardiogram 2D Echocardiogram has been performed.  Darlina Sicilian M 02/19/2019, 1:11 PM

## 2019-02-19 NOTE — TOC Transition Note (Addendum)
Transition of Care [facility]) - CM/SW Discharge Note   Patient Details  Name: Sheila Webster MRN: [id number] Date of Birth: 1956/03/02  Transition of Care [facility]) CM/SW Contact:  Sharin Mons, RN Phone Number: 02/19/2019, 4:08 PM   Clinical Narrative:    Presented with slurred speech and resolved left-sided facial droop with left arm weakness. Transition to home today. Pt without PCP, jobless, limited income. NCM spoke with pt regarding [place],...pt interested . Hospital f/u scheduled and noted on AVS. Pt to f/u with social services for [facility] application process.   [facility] [facility] to provide pt with  Rx meds prior to d/c( assistance with meds / TOC petty cash).  Pt states has transportation to home.  Final next level of care: Home/Self Care Barriers to Discharge: No Barriers Identified   Patient Goals and CMS Choice        Discharge Placement                       Discharge Plan and Services                DME Arranged: N/A DME Agency: NA       HH Arranged: NA HH Agency: NA        Social Determinants of Health (SDOH) Interventions     Readmission Risk Interventions No flowsheet data found.

## 2019-02-19 NOTE — Evaluation (Signed)
Speech Language Pathology Evaluation Patient Details Name: Sheila Webster MRN: [id number] DOB: 1956-05-03 Today's Date: 02/19/2019 Time: 1200-1220 SLP Time Calculation (min) (ACUTE ONLY): 20 min  Problem List:  Patient Active Problem List   Diagnosis Date Noted  . Pure hypercholesterolemia   . CVA (cerebral vascular accident) ([place]) 02/17/2019  . Essential hypertension   . Tobacco use    Past Medical History:  Past Medical History:  Diagnosis Date  . Hypertension   . Irregular heartbeat    Past Surgical History:  Past Surgical History:  Procedure Laterality Date  . BREAST SURGERY    . KNEE SURGERY Right    HPI:  Pt is a 63 y.o. female who presented with slurred speech and resolved left-sided facial droop with left arm weakness. MR Brain on 6/20 revealed 2 cm linear acute/early subacute infarction within the left mid corona radiata.PMH is significant for tobacco use, hypertension.   Assessment / Plan / Recommendation Clinical Impression  Patient presents with cognitive-linguistic skills that are WNL and speech WFL. Patient had very mild deviation of tongue to right and very mild right sided labial weakness, but intelligibility of speech was 100% and patient is likely at or very near baseline for speech articulation accuracy. Patient does not require skilled SLP services.    SLP Assessment  SLP Recommendation/Assessment: Patient does not need any further Speech Lanaguage Pathology Services    Follow Up Recommendations  None    Frequency and Duration     N/A      SLP Evaluation Cognition  Overall Cognitive Status: Within Functional Limits for tasks assessed Orientation Level: Oriented X4       Comprehension  Auditory Comprehension Overall Auditory Comprehension: Appears within functional limits for tasks assessed    Expression Expression Primary Mode of Expression: Verbal Verbal Expression Overall Verbal Expression: Appears within functional limits for tasks  assessed   Oral / Motor  Oral Motor/Sensory Function Overall Oral Motor/Sensory Function: Mild impairment Facial ROM: Within Functional Limits Facial Symmetry: Abnormal symmetry right Facial Strength: Within Functional Limits Facial Sensation: Within Functional Limits Lingual ROM: Within Functional Limits Lingual Symmetry: Abnormal symmetry right Lingual Strength: Within Functional Limits Lingual Sensation: Within Functional Limits Velum: Within Functional Limits Mandible: Within Functional Limits Motor Speech Overall Motor Speech: Other (comment)(Patient's speech intelligibility is 100% and she is likely at or very near baseline for speech articulation.She reported that when talking to family on phone while here in hospital, they felt that her speech was normal now.)   GO                    Dannial Monarch 02/19/2019, 1:03 PM   Sonia Baller, [place], [place] [facility] Pager: [phone]

## 2019-02-19 NOTE — Progress Notes (Signed)
Patient was discharged home by MD order; discharged instructions  review and give to patient with care notes; IV DIC; skin intact; patient will be escorted to the car by nurse tech via wheelchair.  

## 2019-02-22 LAB — NOVEL CORONAVIRUS, NAA (HOSP ORDER, SEND-OUT TO REF LAB; TAT 18-24 HRS): SARS-CoV-2, NAA: NOT DETECTED

## 2019-02-27 NOTE — Progress Notes (Signed)
Patient ID: [name], female   DOB: Nov 09, 1955, 63 y.o.   MRN: [id number]   Virtual Visit via Telephone Note  I connected with Sheila Webster on 02/28/19 at  3:50 PM EDT by telephone and verified that I am speaking with the correct person using two identifiers.   I discussed the limitations, risks, security and privacy concerns of performing an evaluation and management service by telephone and the availability of in person appointments. I also discussed with the patient that there may be a patient responsible charge related to this service. The patient expressed understanding and agreed to proceed.  Patient location:  home My Location:  [facility] office Persons on the call:  Me and the patient  History of Present Illness: After hospitalization from 6/20-6/22/2020 after CVA.  She is having some occasional dizziness in the evenings that she has been having for several months.  This is not new.  Otherwise she feels fine.  No further s/sx of stroke.  Appetite is good.  Taking plavix and aspirin X 3 weeks then aspirin thereafter per discharge summary.  She was also started on a statin.  No muscle/body aches.  She is down to 1 cig every other day.  No CP/SOB  From discharge summary: Brief Hospital Course:  Sheila Webster is a 63 y.o. female with past medical history significant for tobacco use and hypertension, who presented with slurred speech and resolved left-sided facial droop with left arm weakness and found to have a left mid corona radiata infarction. See H&P for HPI details. At time of presentation patient was back to baseline with no focal neurologic deficits. Last known normal was 6/19. Initial work up as significant for CT head negative for acute abnormality and minimal chronic small vessel ischemic changes. Subsequent MRI revealed a2 cm linear acute/early subacute infarction within the left mid corona radiata. No associated hemorrhage or mass effect. Mild chronic microvascular ischemic  changes and volume loss of the brain. Patient was started on ASA and statin on admission with permissive hypertension. Neurology was consulted and further stroke work up initiated. Echo, Carotid doppler, transcranial doppler were negative for contribution to stroke. PT and OT worked with patient with no further recommendations. She was discharged on duel antiplatelet therapy and statin. At time of discharge, patient was stable and at baseline. Return precautions discussed and close follow up arranged.   Issues for Follow Up:  1. Consider HTN management 2. Consider outpatient holter for bradycardic episodes 3. Recommended by neurology for ASA + plavix x3 weeks followed by ASA alone 4. Ensure continued statin use   Observations/Objective: A&Ox3   Assessment and Plan: 1. Cerebrovascular accident (CVA) due to thrombosis of cerebral artery (HCC) 3 weeks plavix and aspirin; then aspirin thereafter   2. Pure hypercholesterolemia Continue atorvastatin  3. Tobacco use Continue with cessation efforts  4. Hospital discharge follow-up Doing well  Follow Up Instructions: Assign PCP in 1 month   I discussed the assessment and treatment plan with the patient. The patient was provided an opportunity to ask questions and all were answered. The patient agreed with the plan and demonstrated an understanding of the instructions.   The patient was advised to call back or seek an in-person evaluation if the symptoms worsen or if the condition fails to improve as anticipated.  I provided 11 minutes of non-face-to-face time during this encounter.   Freeman Caldron, PA-C

## 2019-02-28 ENCOUNTER — Ambulatory Visit: Payer: Self-pay | Attending: Family Medicine | Admitting: Physician Assistant

## 2019-02-28 ENCOUNTER — Other Ambulatory Visit: Payer: Self-pay

## 2019-02-28 DIAGNOSIS — E7800 Pure hypercholesterolemia, unspecified: Secondary | ICD-10-CM

## 2019-02-28 DIAGNOSIS — Z09 Encounter for follow-up examination after completed treatment for conditions other than malignant neoplasm: Secondary | ICD-10-CM

## 2019-02-28 DIAGNOSIS — Z720 Tobacco use: Secondary | ICD-10-CM

## 2019-02-28 DIAGNOSIS — I6330 Cerebral infarction due to thrombosis of unspecified cerebral artery: Secondary | ICD-10-CM

## 2019-02-28 NOTE — Progress Notes (Signed)
Patient verified DOB Patient has taken medication. Patient has eaten today. Patient denies pain at this time. Patient complains of dizziness when she eats dinner, patient has been baking food and limiting salt. Dizziness began prior to Hospital visit.

## 2019-03-19 MED FILL — ATORVASTATIN 80 MG TABLET: 80 | 30 days supply | Qty: 30 | Fill #0

## 2019-04-12 ENCOUNTER — Encounter: Payer: Self-pay | Admitting: Internal Medicine

## 2019-04-12 ENCOUNTER — Ambulatory Visit: Payer: Self-pay | Attending: Internal Medicine | Admitting: Internal Medicine

## 2019-04-12 ENCOUNTER — Other Ambulatory Visit: Payer: Self-pay

## 2019-04-12 VITALS — BP 138/92 | HR 60 | Temp 98.5°F | Resp 16 | Ht 64.0 in | Wt 220.0 lb

## 2019-04-12 DIAGNOSIS — E669 Obesity, unspecified: Secondary | ICD-10-CM

## 2019-04-12 DIAGNOSIS — F17200 Nicotine dependence, unspecified, uncomplicated: Secondary | ICD-10-CM

## 2019-04-12 DIAGNOSIS — Z1239 Encounter for other screening for malignant neoplasm of breast: Secondary | ICD-10-CM

## 2019-04-12 DIAGNOSIS — Z8673 Personal history of transient ischemic attack (TIA), and cerebral infarction without residual deficits: Secondary | ICD-10-CM

## 2019-04-12 DIAGNOSIS — I10 Essential (primary) hypertension: Secondary | ICD-10-CM

## 2019-04-12 DIAGNOSIS — Z1211 Encounter for screening for malignant neoplasm of colon: Secondary | ICD-10-CM

## 2019-04-12 DIAGNOSIS — L732 Hidradenitis suppurativa: Secondary | ICD-10-CM

## 2019-04-12 MED ORDER — AMLODIPINE BESYLATE 5 MG PO TABS: 5.0000 mg | ORAL_TABLET | Freq: Every day | ORAL | 3 refills | Status: DC

## 2019-04-12 MED FILL — ATORVASTATIN 80 MG TABLET: 80 | 30 days supply | Qty: 30 | Fill #1

## 2019-04-12 MED FILL — AMLODIPINE BESYLATE 5 MG TA: 5 | 30 days supply | Qty: 30 | Fill #0

## 2019-04-12 NOTE — Patient Instructions (Addendum)
Your blood pressure is not at goal.  The goal is 130/80 or lower.  Try to limit salt in the foods.  We have started a blood pressure medication called amlodipine that you take daily.   Hidradenitis Suppurativa Hidradenitis suppurativa is a long-term (chronic) skin disease. It is similar to a severe form of acne, but it affects areas of the body where acne would be unusual, especially areas of the body where skin rubs against skin and becomes moist. These include:  Underarms.  Groin.  Genital area.  Buttocks.  Upper thighs.  Breasts. Hidradenitis suppurativa may start out as small lumps or pimples caused by blocked sweat glands or hair follicles. Pimples may develop into deep sores that break open (rupture) and drain pus. Over time, affected areas of skin may thicken and become scarred. This condition is rare and does not spread from person to person (non-contagious). What are the causes? The exact cause of this condition is not known. It may be related to:  Female and female hormones.  An overactive disease-fighting system (immune system). The immune system may over-react to blocked hair follicles or sweat glands and cause swelling and pus-filled sores. What increases the risk? You are more likely to develop this condition if you:  Are female.  Are 6611-63 years old.  Have a family history of hidradenitis suppurativa.  Have a personal history of acne.  Are overweight.  Smoke.  Take the medicine lithium. What are the signs or symptoms? The first symptoms are usually painful bumps in the skin, similar to pimples. The condition may get worse over time (progress), or it may only cause mild symptoms. If the disease progresses, symptoms may include:  Skin bumps getting bigger and growing deeper into the skin.  Bumps rupturing and draining pus.  Itchy, infected skin.  Skin getting thicker and scarred.  Tunnels under the skin (fistulas) where pus drains from a bump.  Pain  during daily activities, such as pain during walking if your groin area is affected.  Emotional problems, such as stress or depression. This condition may affect your appearance and your ability or willingness to wear certain clothes or do certain activities. How is this diagnosed? This condition is diagnosed by a health care provider who specializes in skin diseases (dermatologist). You may be diagnosed based on:  Your symptoms and medical history.  A physical exam.  Testing a pus sample for infection.  Blood tests. How is this treated? Your treatment will depend on how severe your symptoms are. The same treatment will not work for everybody with this condition. You may need to try several treatments to find what works best for you. Treatment may include:  Cleaning and bandaging (dressing) your wounds as needed.  Lifestyle changes, such as new skin care routines.  Taking medicines, such as: ? Antibiotics. ? Acne medicines. ? Medicines to reduce the activity of the immune system. ? A diabetes medicine (metformin). ? Birth control pills, for women. ? Steroids to reduce swelling and pain.  Working with a mental health care provider, if you experience emotional distress due to this condition. If you have severe symptoms that do not get better with medicine, you may need surgery. Surgery may involve:  Using a laser to clear the skin and remove hair follicles.  Opening and draining deep sores.  Removing the areas of skin that are diseased and scarred. Follow these instructions at home: Medicines   Take over-the-counter and prescription medicines only as told by your health care  provider.  If you were prescribed an antibiotic medicine, take it as told by your health care provider. Do not stop taking the antibiotic even if your condition improves. Skin care  If you have open wounds, cover them with a clean dressing as told by your health care provider. Keep wounds clean by  washing them gently with soap and water when you bathe.  Do not shave the areas where you get hidradenitis suppurativa.  Do not wear deodorant.  Wear loose-fitting clothes.  Try to avoid getting overheated or sweaty. If you get sweaty or wet, change into clean, dry clothes as soon as you can.  To help relieve pain and itchiness, cover sore areas with a warm, clean washcloth (warm compress) for 5-10 minutes as often as needed.  If told by your health care provider, take a bleach bath twice a week: ? Fill your bathtub halfway with water. ? Pour in  cup of unscented household bleach. ? Soak in the tub for 5-10 minutes. ? Only soak from the neck down. Avoid water on your face and hair. ? Shower to rinse off the bleach from your skin. General instructions  Learn as much as you can about your disease so that you have an active role in your treatment. Work closely with your health care provider to find treatments that work for you.  If you are overweight, work with your health care provider to lose weight as recommended.  Do not use any products that contain nicotine or tobacco, such as cigarettes and e-cigarettes. If you need help quitting, ask your health care provider.  If you struggle with living with this condition, talk with your health care provider or work with a mental health care provider as recommended.  Keep all follow-up visits as told by your health care provider. This is important. Where to find more information  Hidradenitis Suppurativa Foundation, Inc.: https://www.hs-foundation.org/ Contact a health care provider if you have:  A flare-up of hidradenitis suppurativa.  A fever or chills.  Trouble controlling your symptoms at home.  Trouble doing your daily activities because of your symptoms.  Trouble dealing with emotional problems related to your condition. Summary  Hidradenitis suppurativa is a long-term (chronic) skin disease. It is similar to a severe form  of acne, but it affects areas of the body where acne would be unusual.  The first symptoms are usually painful bumps in the skin, similar to pimples. The condition may get worse over time (progress), or it may only cause mild symptoms.  If you have open wounds, cover them with a clean dressing as told by your health care provider. Keep wounds clean by washing them gently with soap and water when you bathe.  Besides skin care, treatment may include medicines, laser treatment, and surgery. This information is not intended to replace advice given to you by your health care provider. Make sure you discuss any questions you have with your health care provider. Document Released: 03/30/2004 Document Revised: 08/24/2017 Document Reviewed: 08/24/2017 Elsevier Patient Education  2020 [employer]  Follow a Healthy Eating Plan - You can do it! Limit sugary drinks.  Avoid sodas, sweet tea, sport or energy drinks, or fruit drinks.  Drink water, lo-fat milk, or diet drinks. Limit snack foods.   Cut back on candy, cake, cookies, chips, ice cream.  These are a special treat, only in small amounts. Eat plenty of vegetables.  Especially dark green, red, and orange vegetables. Aim for at least 3 servings a day.  More is better! Include fruit in your daily diet.  Whole fruit is much healthier than fruit juice! Limit "white" bread, "white" pasta, "white" rice.   Choose "100% whole grain" products, brown or wild rice. Avoid fatty meats. Try "Meatless Monday" and choose eggs or beans one day a week.  When eating meat, choose lean meats like chicken, [place], and fish.  Grill, broil, or bake meats instead of frying, and eat poultry without the skin. Eat less salt.  Avoid frozen pizzas, frozen dinners and salty foods.  Use seasonings other than salt in cooking.  This can help blood pressure and keep you from swelling Beer, wine and liquor have calories.  If you can safely drink alcohol, limit to 1 drink per day for  women, 2 drinks for men

## 2019-04-12 NOTE — Progress Notes (Signed)
Pt states when she left the hospital she was 209  Pt states since she stopped smoking she has gained Weight

## 2019-04-12 NOTE — Progress Notes (Signed)
Patient ID: Sheila Webster, female    DOB: Jul 02, 1956  MRN: [id number]  CC: Establish Care and Weight Gain   Subjective: Sheila Webster is a 63 y.o. female who presents to establish care with me and hosp f/u Her concerns today include:  Patient with history of CVA (LT mid corona radiata infarct), tobacco dependence, HTN,  This patient was hospitalized in June of this year with acute left mid corona radiata infarct.  She presented to the emergency room with slurred speech, left-sided facial droop and left arm weakness.  Echo, carotid Doppler and transcranial Doppler were negative for contribution to stroke.  PT and OT work with patient with no further recommendations.  She was discharged on dual antiplatelet therapy being aspirin and Plavix and statin therapy.  At the time of discharge patient was stable and at her baseline.  She was noted to have one episode of bradycardia during her sleep but patient was asymptomatic.  Patient had a telephone visit with our PA last month posthospitalization.  Today:  CVA: Patient has completed her 3 weeks of Plavix.  She is currently now on aspirin and Lipitor.  She reports that the facial droop, slurred speech and left arm weakness have resolved.  Tobacco dependence: "I have cut back tremendously.  1 pack now last meal week."  She plans to quit but is concerned that she is now gaining weight.  She tells me that in the past she quit smoking during her pregnancies.  She had also quit last year for 3 to 4 months after moving in with her sister who did not want her to smoke in her house.  She smokes because she is bored and not working.  Obesity: She is working on trying to get her weight back down.  Used to drink 4-5 sodas a day and ate a lot of fried foods.  She tells me she has cut back on both.  She finds herself waking up in the middle of the night wanting to snack.  She usually eats a granola bar or a few spoons of peanut butter.  She is not very active.  "I  have poor willpower.  When I was first discharged from the hospital and was walking 30 minutes/day."  HTN: She does have a device at home.  She checks her blood pressure every other day.  She states her range has been 130s/ mid 80s.  She wanted to let me know that she has a history of recurrent boils in the axilla, the buttock and the inguinal areas.  She tells me that she has had surgeries on the both arms  Patient Active Problem List   Diagnosis Date Noted   Obesity (BMI 35.0-39.9 without comorbidity) 04/12/2019   Tobacco dependence 04/12/2019   History of CVA (cerebrovascular accident) without residual deficits 04/12/2019   Pure hypercholesterolemia    CVA (cerebral vascular accident) (HCC) 02/17/2019   Essential hypertension    Tobacco use      Current Outpatient Medications on File Prior to Visit  Medication Sig Dispense Refill   APPLE CIDER VINEGAR PO Take 450 mg by mouth 3 (three) times daily.     aspirin 81 MG chewable tablet Chew 1 tablet (81 mg total) by mouth daily. 30 tablet 11   atorvastatin (LIPITOR) 80 MG tablet Take 1 tablet (80 mg total) by mouth daily at 6 PM. 30 tablet 11   No current facility-administered medications on file prior to visit.     No Known  Allergies  Social History   Socioeconomic History   Marital status: Single    Spouse name: Not on file   Number of children: Not on file   Years of education: Not on file   Highest education level: Not on file  Occupational History   Not on file  Social Needs   Financial resource strain: Not on file   Food insecurity    Worry: Not on file    Inability: Not on file   Transportation needs    Medical: Not on file    Non-medical: Not on file  Tobacco Use   Smoking status: Current Every Day Smoker    Packs/day: 0.25    Types: Cigarettes   Smokeless tobacco: Never Used  Substance and Sexual Activity   Alcohol use: No   Drug use: No   Sexual activity: Yes    Birth  control/protection: Post-menopausal  Lifestyle   Physical activity    Days per week: Not on file    Minutes per session: Not on file   Stress: Not on file  Relationships   Social connections    Talks on phone: Not on file    Gets together: Not on file    Attends religious service: Not on file    Active member of club or organization: Not on file    Attends meetings of clubs or organizations: Not on file    Relationship status: Not on file   Intimate partner violence    Fear of current or ex partner: Not on file    Emotionally abused: Not on file    Physically abused: Not on file    Forced sexual activity: Not on file  Other Topics Concern   Not on file  Social History Narrative   Not on file    Family History  Problem Relation Age of Onset   Cancer Mother     Past Surgical History:  Procedure Laterality Date   BREAST SURGERY     KNEE SURGERY Right     ROS: Review of Systems Negative except as stated above  PHYSICAL EXAM: BP (!) 138/92    Pulse 60    Temp 98.5 F (36.9 C) (Oral)    Resp 16    Ht 5\' 4"  (1.626 m)    Wt 220 lb (99.8 kg)    SpO2 98%    BMI 37.76 kg/m   Wt Readings from Last 3 Encounters:  04/12/19 220 lb (99.8 kg)  02/19/19 214 lb 1.1 oz (97.1 kg)  BP 140/84  Physical Exam General appearance - alert, well appearing, older AAFand in no distress Mental status - normal mood, behavior, speech, dress, motor activity, and thought processes Eyes - pupils equal and reactive, extraocular eye movements intact Mouth - mucous membranes moist, pharynx normal without lesions Neck - supple, no significant adenopathy Chest - clear to auscultation, no wheezes, rales or rhonchi, symmetric air entry Heart - normal rate, regular rhythm, normal S1, S2, no murmurs, rubs, clicks or gallops Neurological - cranial nerves II through XII intact, motor and sensory grossly normal bilaterally Extremities - peripheral pulses normal, no pedal edema, no clubbing or  cyanosis MSK: Slight muscle wasting in the left hand with flexion of the fourth and fifth digit at the PIP joint.  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 Skin: Significant scarring in both axilla from previous incision and drainage  CMP Latest Ref Rng & Units 02/18/2019 02/17/2019 11/25/2009  Glucose 70 - 99 mg/dL 123(H) 98 98  BUN 8 - 23 mg/dL 13 16 6   Creatinine 0.44 - 1.00 mg/dL 1.610.66 0.960.96 0.6  Sodium 045135 - 145 mmol/L 139 142 138  Potassium 3.5 - 5.1 mmol/L 3.7 4.5 3.3(L)  Chloride 98 - 111 mmol/L 108 105 106  CO2 22 - 32 mmol/L 24 26 -  Calcium 8.9 - 10.3 mg/dL 4.0(J8.4(L) 9.1 -  Total Protein 6.5 - 8.1 g/dL - 6.9 -  Total Bilirubin 0.3 - 1.2 mg/dL - 8.1(X0.2(L) -  Alkaline Phos 38 - 126 U/L - 82 -  AST 15 - 41 U/L - 15 -  ALT 0 - 44 U/L - 14 -   Lipid Panel     Component Value Date/Time   CHOL 218 (H) 02/18/2019 0332   TRIG 86 02/18/2019 0332   HDL 49 02/18/2019 0332   CHOLHDL 4.4 02/18/2019 0332   VLDL 17 02/18/2019 0332   LDLCALC 152 (H) 02/18/2019 0332    CBC    Component Value Date/Time   WBC 9.3 02/18/2019 1123   RBC 4.54 02/18/2019 1123   HGB 11.6 (L) 02/18/2019 1123   HCT 37.9 02/18/2019 1123   PLT 316 02/18/2019 1123   MCV 83.5 02/18/2019 1123   MCH 25.6 (L) 02/18/2019 1123   MCHC 30.6 02/18/2019 1123   RDW 14.4 02/18/2019 1123   LYMPHSABS 2.8 02/17/2019 1955   MONOABS 0.7 02/17/2019 1955   EOSABS 0.2 02/17/2019 1955   BASOSABS 0.1 02/17/2019 1955    ASSESSMENT AND PLAN: 1. History of CVA (cerebrovascular accident) without residual deficits Clinically stable and appears to have recovered.  Stressed importance of secondary prevention including getting her cholesterol down, Smoking cessation and good blood pressure control.  Continue aspirin and Lipitor  2. Essential hypertension Not at goal of 130/80 or lower.  We will start low-dose of amlodipine.  DASH diet discussed and encouraged. - amLODipine (NORVASC) 5 MG tablet; Take 1 tablet (5 mg total) by mouth daily.   Dispense: 90 tablet; Refill: 3  3. Tobacco dependence Patient advised to quit smoking. Discussed health risks associated with smoking including lung and other types of cancers, chronic lung diseases and CV risks.. Pt ready to give trail of quitting.  Discussed methods to help quit including quitting cold [place]key, use of NRT, Chantix and Bupropion.  Patient declined using medication.  She states that she will continue to cut back on her own.  I have encouraged her to set a quit date.  About 5 minutes spent on counseling.   4. Obesity (BMI 35.0-39.9 without comorbidity) Dietary counseling given.  Printed information also given. Encouraged her to get in some form of moderate intensity exercise at least about 150 minutes total per week  5. Hidradenitis suppurativa Currently she does not have any boils.  Printed information given on this condition  6. Breast cancer screening - MM Digital Screening; Future  7. Screening for colon cancer - Fecal occult blood, imunochemical([facility])    Patient was given the opportunity to ask questions.  Patient verbalized understanding of the plan and was able to repeat key elements of the plan.   Orders Placed This Encounter  Procedures   Fecal occult blood, imunochemical([facility])   MM Digital Screening     Requested Prescriptions   Signed Prescriptions Disp Refills   amLODipine (NORVASC) 5 MG tablet 90 tablet 3    Sig: Take 1 tablet (5 mg total) by mouth daily.    Return in about 6 weeks (around 05/24/2019) for PAP.  Jonah Blueeborah Chastin Riesgo, MD, FACP

## 2019-04-13 ENCOUNTER — Encounter: Payer: Self-pay | Admitting: Internal Medicine

## 2019-04-13 DIAGNOSIS — L732 Hidradenitis suppurativa: Secondary | ICD-10-CM | POA: Insufficient documentation

## 2019-05-09 MED FILL — AMLODIPINE BESYLATE 5 MG TA: 5 | 30 days supply | Qty: 30 | Fill #1

## 2019-05-09 MED FILL — ATORVASTATIN 80 MG TABLET: 80 | 30 days supply | Qty: 30 | Fill #2

## 2019-06-14 MED FILL — AMLODIPINE BESYLATE 5 MG TA: 5 | 60 days supply | Qty: 60 | Fill #2

## 2019-06-14 MED FILL — ATORVASTATIN 80 MG TABLET: 80 | 30 days supply | Qty: 30 | Fill #3

## 2019-07-23 MED FILL — AMLODIPINE BESYLATE 5 MG TA: 5 | 60 days supply | Qty: 60 | Fill #3

## 2019-07-23 MED FILL — ATORVASTATIN 80 MG TABLET: 80 | 60 days supply | Qty: 60 | Fill #4

## 2019-09-25 MED FILL — AMLODIPINE BESYLATE 5 MG TA: 5 | 60 days supply | Qty: 60 | Fill #4

## 2019-09-25 MED FILL — ATORVASTATIN 80 MG TABLET: 80 | 60 days supply | Qty: 60 | Fill #5

## 2019-11-27 MED FILL — AMLODIPINE BESYLATE 5 MG TA: 5 | 60 days supply | Qty: 60 | Fill #5

## 2019-11-27 MED FILL — ATORVASTATIN 80 MG TABLET: 80 | 60 days supply | Qty: 60 | Fill #6

## 2020-01-25 MED FILL — ATORVASTATIN 80 MG TABLET: 80 | 30 days supply | Qty: 30 | Fill #7

## 2020-01-25 MED FILL — AMLODIPINE BESYLATE 5 MG TA: 5 | 60 days supply | Qty: 60 | Fill #6

## 2020-04-30 ENCOUNTER — Other Ambulatory Visit: Payer: Self-pay | Admitting: Internal Medicine

## 2020-04-30 DIAGNOSIS — I10 Essential (primary) hypertension: Secondary | ICD-10-CM

## 2020-04-30 NOTE — Telephone Encounter (Signed)
Requested medication (s) are due for refill today: yes   Requested medication (s) are on the active medication list: yes  Last refill:  01/25/2020  Future visit scheduled: no  Notes to clinic: overdue for follow up  VM left for patient to contact office to schedule appointment    Requested Prescriptions  Pending Prescriptions Disp Refills   amLODipine (NORVASC) 5 MG tablet [Pharmacy Med Name: AMLODIPINE BESYLATE 5 MG TA 5 Tablet] 60 tablet 3    Sig: Take 1 tablet (5 mg total) by mouth daily.      Cardiovascular:  Calcium Channel Blockers Failed - 04/30/2020  8:50 AM      Failed - Last BP in normal range    BP Readings from Last 1 Encounters:  04/12/19 (!) 138/92          Failed - Valid encounter within last 6 months    Recent Outpatient Visits           1 year ago History of CVA (cerebrovascular accident) without residual deficits   [facility] Marcine Matar, MD   1 year ago Cerebrovascular accident (CVA) due to thrombosis of cerebral artery [facility])   [facility] [place], [place], [place]

## 2020-05-12 IMAGING — CT CT HEAD WITHOUT CONTRAST
4 series · 17 of 47 positions shown, 19 images · non-contrast
Comparison: None.

CLINICAL DATA: Slurred speech left facial droop

EXAM:
CT HEAD WITHOUT CONTRAST
TECHNIQUE: Contiguous axial images were obtained from the base of the skull
through the vertex without intravenous contrast.

[Series 3: head wo · axial · 0.42mm/px · z∈[+856,+981]mm · 7 of 35 slices shown, 9 images]
[im 5/35  brain]
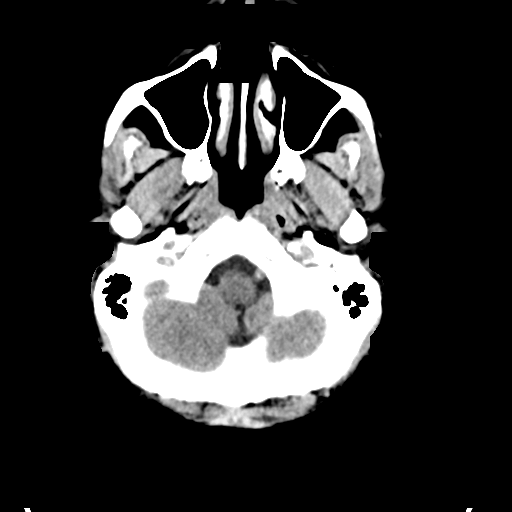
[im 5/35  bone]
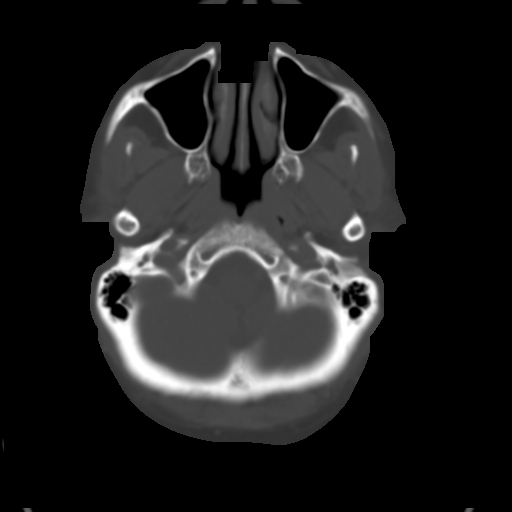
[im 9/35  brain]
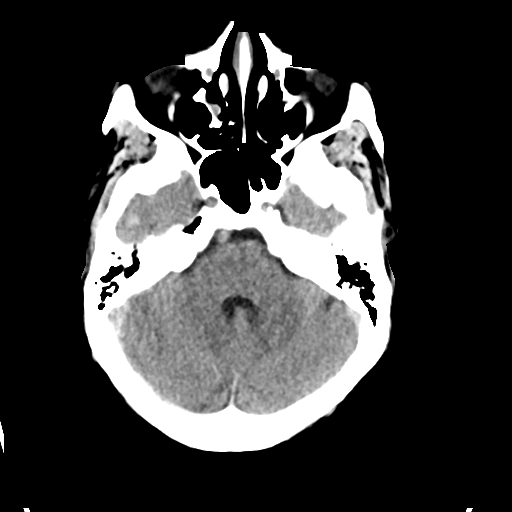
[im 13/35  brain]
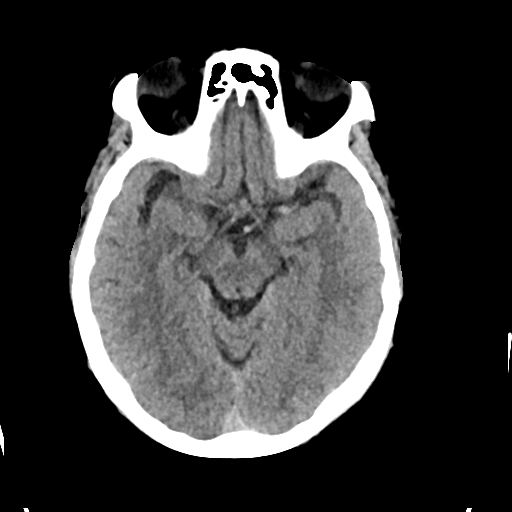
[im 18/35  brain]
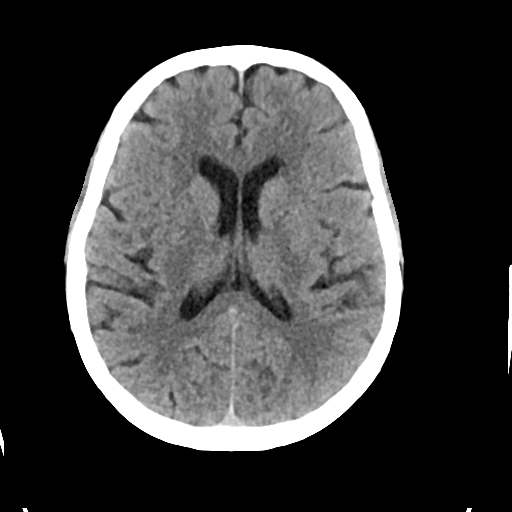
[im 22/35  brain]
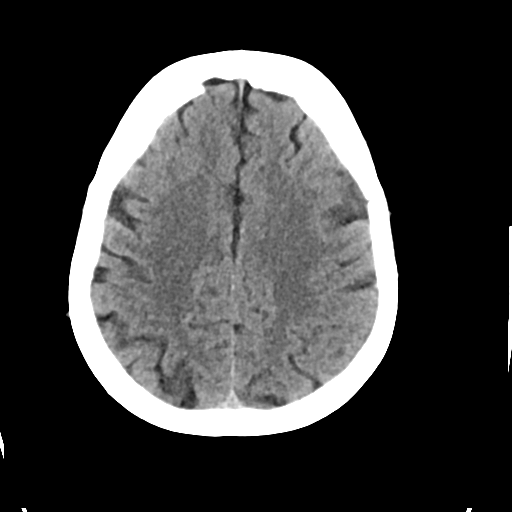
[im 22/35  bone]
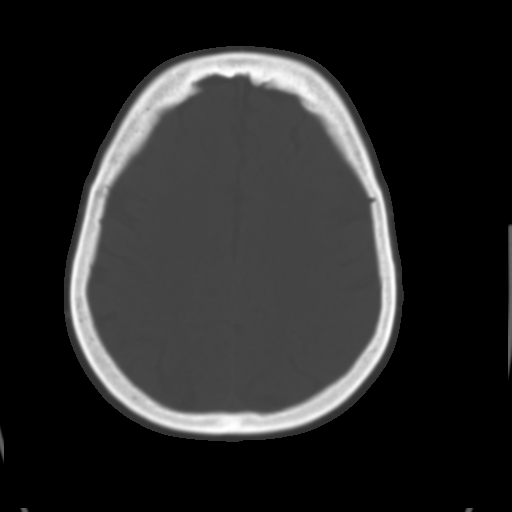
[im 26/35  brain]
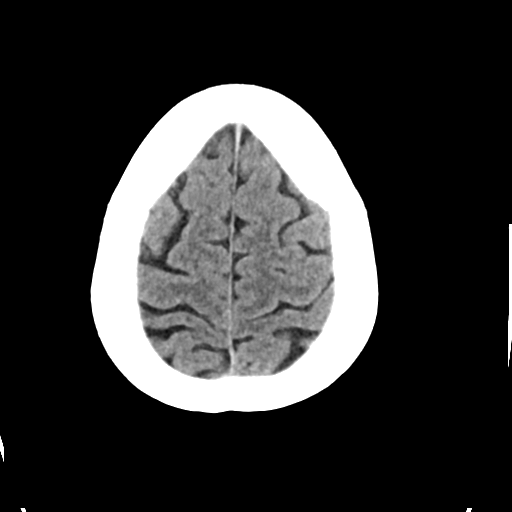
[im 30/35  brain]
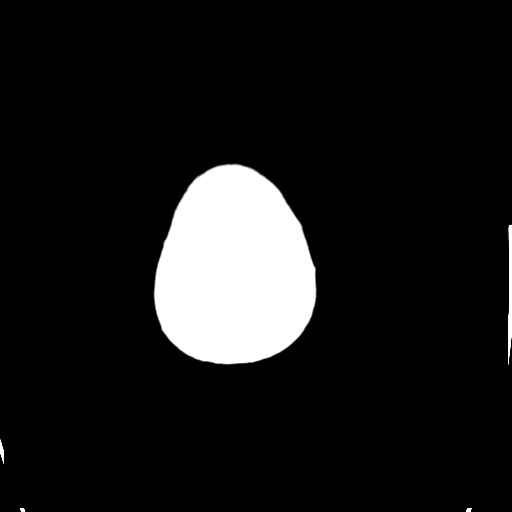

[Series 4: head bone · axial · 0.42mm/px · z∈[+852,+912]mm · 4 of 87 slices shown]
[im 9/87  bone]
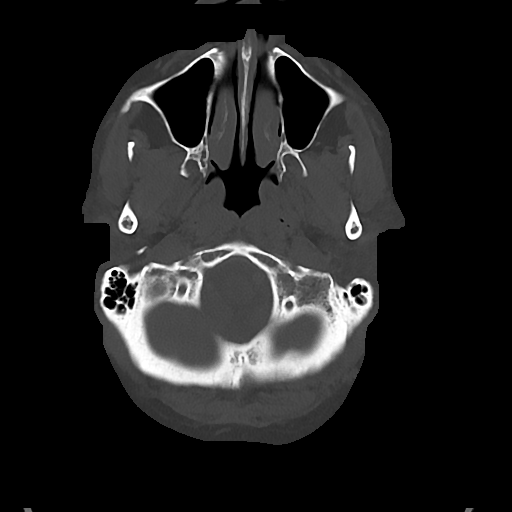
[im 18/87  bone]
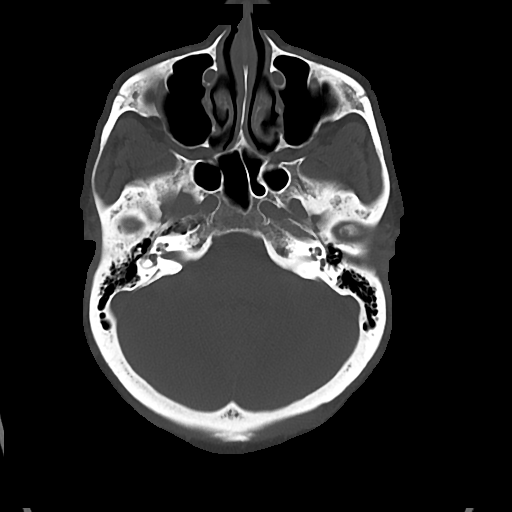
[im 26/87  bone]
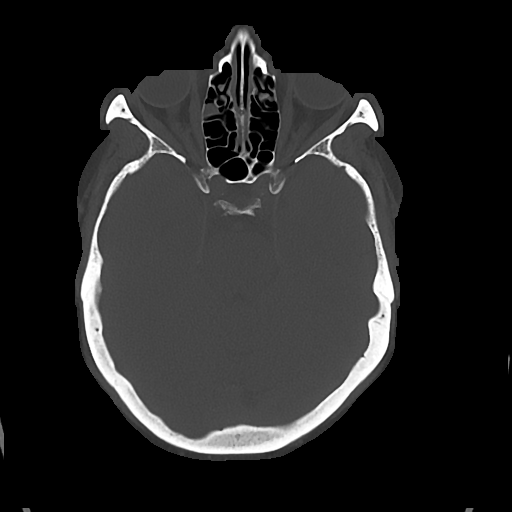
[im 39/87  bone]
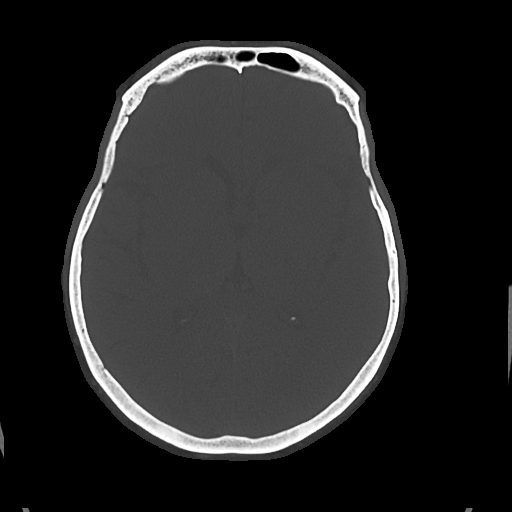

[Series 5: cor soft · coronal · 0.34mm/px · 3 of 67 slices shown]
[im 23/67  brain]
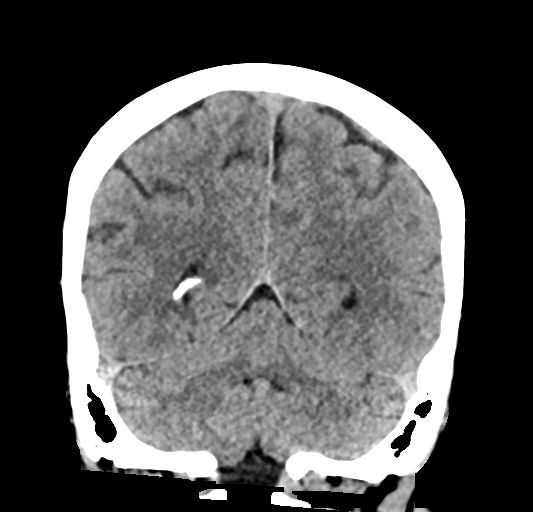
[im 30/67  brain]
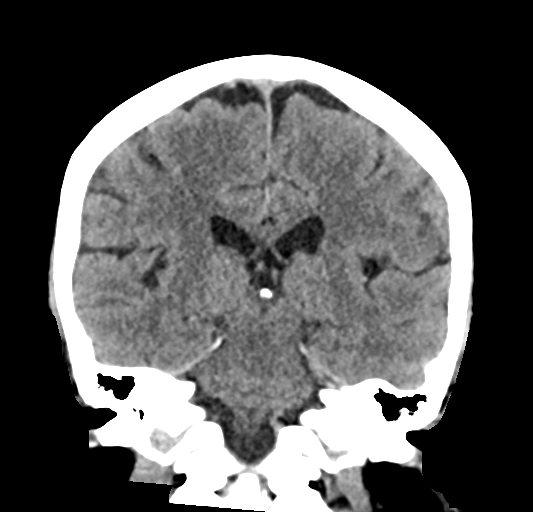
[im 37/67  brain]
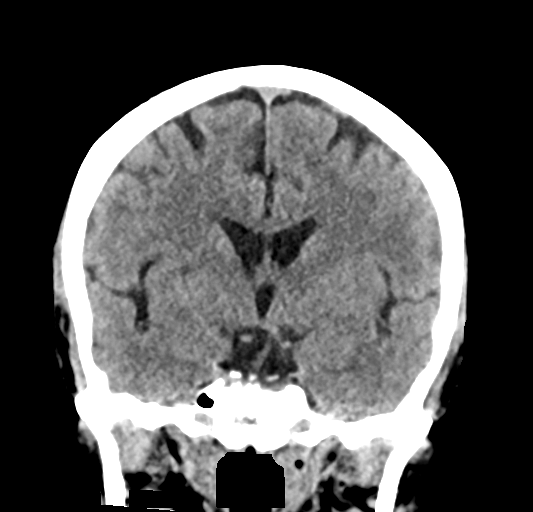

[Series 6: sag soft · sagittal · 0.34mm/px · 3 of 60 slices shown]
[im 20/60  brain]
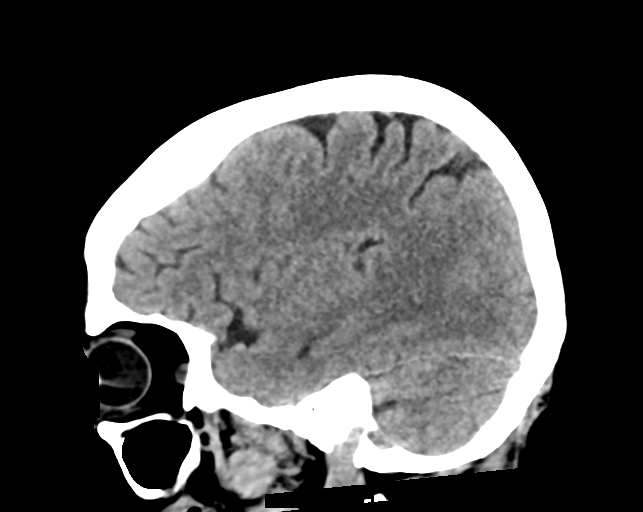
[im 30/60  brain]
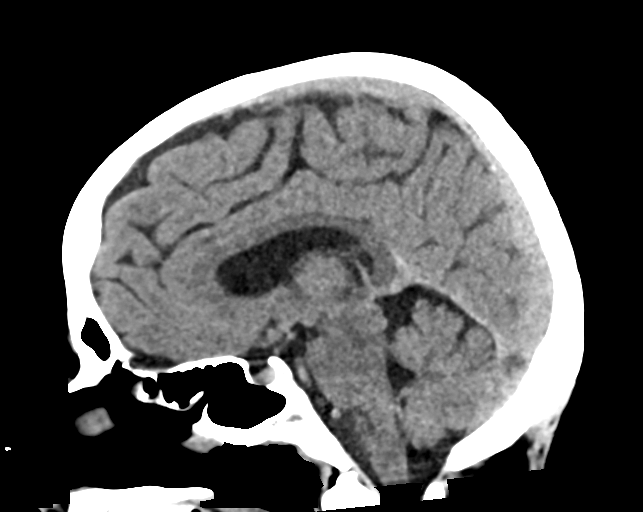
[im 40/60  brain]
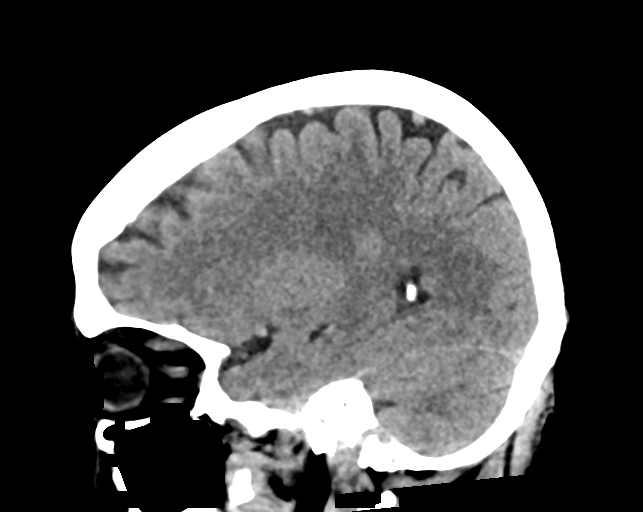

[17 of 47 positions shown; findings below may reference images not displayed]

FINDINGS: Brain: No acute territorial infarction, hemorrhage or intracranial
mass. Chunky calcification left cranial vertex anteriorly, possible
calcified meningioma. Minimal small vessel ischemic changes of the
white matter. Normal ventricle size.

Vascular: No hyperdense vessels. Scattered calcifications at the
carotid siphon

Skull: Normal. Negative for fracture or focal lesion.

Sinuses/Orbits: No acute finding. Patchy mucosal thickening in the
ethmoid sinuses.

Other: None
IMPRESSION: No CT evidence for acute intracranial abnormality. Minimal small
vessel ischemic change of the white matter

## 2020-05-14 ENCOUNTER — Ambulatory Visit: Payer: Self-pay | Admitting: Family Medicine

## 2021-04-16 ENCOUNTER — Other Ambulatory Visit: Payer: Self-pay

## 2021-04-16 ENCOUNTER — Ambulatory Visit: Payer: Medicare Other | Attending: Physician Assistant | Admitting: Physician Assistant

## 2021-04-16 VITALS — BP 149/87 | HR 58 | Ht 64.0 in | Wt 227.0 lb

## 2021-04-16 DIAGNOSIS — E7800 Pure hypercholesterolemia, unspecified: Secondary | ICD-10-CM | POA: Diagnosis not present

## 2021-04-16 DIAGNOSIS — I10 Essential (primary) hypertension: Secondary | ICD-10-CM

## 2021-04-16 DIAGNOSIS — R7303 Prediabetes: Secondary | ICD-10-CM

## 2021-04-16 DIAGNOSIS — K029 Dental caries, unspecified: Secondary | ICD-10-CM

## 2021-04-16 DIAGNOSIS — H539 Unspecified visual disturbance: Secondary | ICD-10-CM

## 2021-04-16 MED ORDER — AMLODIPINE BESYLATE 5 MG PO TABS
5.0000 mg | ORAL_TABLET | Freq: Every day | ORAL | 3 refills | Status: DC
  Filled 2021-04-16: qty 90, 90d supply, fill #0
  Filled 2021-07-11: qty 90, 90d supply, fill #1
  Filled 2021-10-11: qty 90, 90d supply, fill #2
  Filled 2021-10-12: qty 90, 90d supply, fill #0
  Filled 2022-01-04: qty 90, 90d supply, fill #1

## 2021-04-16 MED ORDER — ATORVASTATIN CALCIUM 80 MG PO TABS
80.0000 mg | ORAL_TABLET | Freq: Every day | ORAL | 11 refills | Status: DC
  Filled 2021-04-16: qty 90, 90d supply, fill #0
  Filled 2021-07-11: qty 90, 90d supply, fill #1
  Filled 2021-10-11: qty 90, 90d supply, fill #2
  Filled 2021-10-12: qty 90, 90d supply, fill #0

## 2021-04-16 NOTE — Progress Notes (Signed)
Pt has not been taking any medication. 

## 2021-04-16 NOTE — Patient Instructions (Signed)

## 2021-04-16 NOTE — Progress Notes (Signed)
Sheila Webster, is a 65 y.o. female  YHC:[id number]  DVV:[id number]  DOB - [birth year]  Chief Complaint  Patient presents with   Hypertension       Subjective:   Sheila Webster is a 65 y.o. female here today for med RF.  Not taking meds for a long time but now has medicaid.   No CP/SOB/HA/dizziness.  No labs in 2 years.    Needs referral to ophthalmologist.  Needs referral for dentist   No problems updated.  ALLERGIES: No Known Allergies  PAST MEDICAL HISTORY: Past Medical History:  Diagnosis Date   Hypertension    Irregular heartbeat     MEDICATIONS AT HOME: Prior to Admission medications   Medication Sig Start Date End Date Taking? Authorizing Provider  aspirin 81 MG chewable tablet Chew 1 tablet (81 mg total) by mouth daily. 02/20/19  Yes Garnette Gunner, MD  amLODipine (NORVASC) 5 MG tablet Take 1 tablet (5 mg total) by mouth daily. 04/16/21   Anders Simmonds, PA-C  APPLE CIDER VINEGAR PO Take 450 mg by mouth 3 (three) times daily. Patient not taking: Reported on 04/16/2021    [provider]  atorvastatin (LIPITOR) 80 MG tablet Take 1 tablet (80 mg total) by mouth daily at 6 PM. 04/16/21   Kemond Amorin, Marzella Schlein, PA-C    ROS: Neg HEENT Neg resp Neg cardiac Neg GI Neg GU Neg MS Neg psych Neg neuro  Objective:   Vitals:   04/16/21 1450  BP: (!) 149/87  Pulse: (!) 58  SpO2: 97%  Weight: 227 lb (103 kg)  Height: 5\' 4"  (1.626 m)   Exam General appearance : Awake, alert, not in any distress. Speech Clear. Not toxic looking HEENT: Atraumatic and NormocephalicNeck: Supple, no JVD. No cervical lymphadenopathy.  Chest: Good air entry bilaterally, CTAB.  No rales/rhonchi/wheezing CVS: S1 S2 regular, no murmurs.  Extremities: B/L Lower Ext shows no edema, both legs are warm to touch Neurology: Awake alert, and oriented X 3, CN II-XII intact, Non focal Skin: No Rash  Data Review Lab Results  Component Value Date   HGBA1C 5.9 (H) 02/17/2019     Assessment & Plan   1. Essential hypertension uncontrolled - Comprehensive metabolic panel - amLODipine (NORVASC) 5 MG tablet; Take 1 tablet (5 mg total) by mouth daily.  Dispense: 90 tablet; Refill: 3  2. Pure hypercholesterolemia - Lipid panel - Comprehensive metabolic panel - atorvastatin (LIPITOR) 80 MG tablet; Take 1 tablet (80 mg total) by mouth daily at 6 PM.  Dispense: 30 tablet; Refill: 11  3. Vision changes - Ambulatory referral to Ophthalmology  4. Dental caries - Ambulatory referral to Dentistry  5. Prediabetes-A1C I have had a lengthy discussion and provided education about insulin resistance and the intake of too much sugar/refined carbohydrates.  I have advised the patient to work at a goal of eliminating sugary drinks, candy, desserts, sweets, refined sugars, processed foods, and white carbohydrates.  The patient expresses understanding.       Patient have been counseled extensively about nutrition and exercise. Other issues discussed during this visit include: low cholesterol diet, weight control and daily exercise, foot care, annual eye examinations at Ophthalmology, importance of adherence with medications and regular follow-up. We also discussed long term complications of uncontrolled diabetes and hypertension.   Return in about 6 months (around 10/17/2021) for PCP/chronic conditions.  The patient was given clear instructions to go to ER or return to medical center if symptoms don't improve, worsen or new  problems develop. The patient verbalized understanding. The patient was told to call to get lab results if they haven't heard anything in the next week.      Georgian Co, PA-C [facility] [place], [place] [phone]   04/16/2021, 3:26 PM Patient ID: Sheila Webster, female   DOB: May 01, 1956, 65 y.o.   MRN: [id number]

## 2021-04-17 LAB — HEMOGLOBIN A1C
Est. average glucose Bld gHb Est-mCnc: 120 mg/dL
Hgb A1c MFr Bld: 5.8 % — ABNORMAL HIGH (ref 4.8–5.6)

## 2021-04-17 LAB — COMPREHENSIVE METABOLIC PANEL
ALT: 10 IU/L (ref 0–32)
AST: 11 IU/L (ref 0–40)
Albumin/Globulin Ratio: 1.3 (ref 1.2–2.2)
Albumin: 4 g/dL (ref 3.8–4.8)
Alkaline Phosphatase: 101 IU/L (ref 44–121)
BUN/Creatinine Ratio: 21 (ref 12–28)
BUN: 14 mg/dL (ref 8–27)
Bilirubin Total: <0.2 mg/dL (ref 0.0–1.2)
CO2: 24 mmol/L (ref 20–29)
Calcium: 9.2 mg/dL (ref 8.7–10.3)
Chloride: 103 mmol/L (ref 96–106)
Creatinine, Ser: 0.67 mg/dL (ref 0.57–1.00)
Globulin, Total: 3 g/dL (ref 1.5–4.5)
Glucose: 97 mg/dL (ref 65–99)
Potassium: 4.5 mmol/L (ref 3.5–5.2)
Sodium: 138 mmol/L (ref 134–144)
Total Protein: 7 g/dL (ref 6.0–8.5)
eGFR: 97 mL/min/{1.73_m2} (ref >59)

## 2021-04-17 LAB — LIPID PANEL
Chol/HDL Ratio: 5.4 ratio — ABNORMAL HIGH (ref 0.0–4.4)
Cholesterol, Total: 250 mg/dL — ABNORMAL HIGH (ref 100–199)
HDL: 46 mg/dL (ref >39)
LDL Chol Calc (NIH): 175 mg/dL — ABNORMAL HIGH (ref 0–99)
Triglycerides: 156 mg/dL — ABNORMAL HIGH (ref 0–149)
VLDL Cholesterol Cal: 29 mg/dL (ref 5–40)

## 2021-05-17 ENCOUNTER — Ambulatory Visit (HOSPITAL_BASED_OUTPATIENT_CLINIC_OR_DEPARTMENT_OTHER): Payer: Medicare Other

## 2021-05-17 DIAGNOSIS — Z0000 Encounter for general adult medical examination without abnormal findings: Secondary | ICD-10-CM

## 2021-05-17 NOTE — Progress Notes (Signed)
Subjective:   Sheila Webster is a 65 y.o. female who presents for an Initial Medicare Annual Wellness Visit.  I connected with  Sheila Webster on 05/17/21 by a audio enabled telemedicine application and verified that I am speaking with the correct person using two identifiers.   Location of patient: Home Location of provider: Office  Persons participating in visit .Marland KitchenMarland KitchenWynelle Webster (patient) Erick Alley RMA  I discussed the limitations of evaluation and management by telemedicine. The patient expressed understanding and agreed to proceed.   Review of Systems    Defer to PCP       Objective:    There were no vitals filed for this visit. There is no height or weight on file to calculate BMI.  Advanced Directives 05/17/2021 02/18/2019 02/17/2019 03/22/2018 06/07/2017  Does Patient Have a Medical Advance Directive? No No No No No  Would patient like information on creating a medical advance directive? Yes (ED - Information included in AVS) No - Patient declined No - Patient declined No - Patient declined -    Current Medications (verified) Outpatient Encounter Medications as of 05/17/2021  Medication Sig   amLODipine (NORVASC) 5 MG tablet Take 1 tablet (5 mg total) by mouth daily.   APPLE CIDER VINEGAR PO Take 450 mg by mouth 3 (three) times daily.   aspirin 81 MG chewable tablet Chew 1 tablet (81 mg total) by mouth daily.   atorvastatin (LIPITOR) 80 MG tablet Take 1 tablet (80 mg total) by mouth daily at 6 PM.   No facility-administered encounter medications on file as of 05/17/2021.    Allergies (verified) Patient has no known allergies.   History: Past Medical History:  Diagnosis Date   Hypertension    Irregular heartbeat    Past Surgical History:  Procedure Laterality Date   BREAST SURGERY     KNEE SURGERY Right    Family History  Problem Relation Age of Onset   Cancer Mother    Lymphoma Sister    Social History   Socioeconomic History   Marital status:  Single    Spouse name: Not on file   Number of children: Not on file   Years of education: Not on file   Highest education level: Not on file  Occupational History   Occupation: Unemployed  Tobacco Use   Smoking status: Every Day    Packs/day: 0.25    Types: Cigarettes   Smokeless tobacco: Never  Substance and Sexual Activity   Alcohol use: No   Drug use: No   Sexual activity: Yes    Birth control/protection: Post-menopausal  Other Topics Concern   Not on file  Social History Narrative   Not on file   Social Determinants of Health   Financial Resource Strain: Not on file  Food Insecurity: Not on file  Transportation Needs: No Transportation Needs   Lack of Transportation (Medical): No   Lack of Transportation (Non-Medical): No  Physical Activity: Insufficiently Active   Days of Exercise per Week: 3 days   Minutes of Exercise per Session: 30 min  Stress: Not on file  Social Connections: Not on file    Tobacco Counseling Ready to quit: Not Answered Counseling given: Not Answered   Clinical Intake:     Pain : No/denies pain     Nutritional Risks: None Diabetes: No  How often do you need to have someone help you when you read instructions, pamphlets, or other written materials from your doctor or pharmacy?: 1 -  Never  Diabetic?No  Interpreter Needed?: No      Activities of Daily Living In your present state of health, do you have any difficulty performing the following activities: 05/17/2021  Hearing? N  Vision? Y  Difficulty concentrating or making decisions? N  Walking or climbing stairs? N  Dressing or bathing? N  Doing errands, shopping? N  Some recent data might be hidden    Patient Care Team: Marcine Matar, MD as PCP - General (Internal Medicine)  Indicate any recent Medical Services you may have received from other than [facility] providers in the past year (date may be approximate).     Assessment:   This is a routine wellness  examination for [name].  Hearing/Vision screen No results found.  Dietary issues and exercise activities discussed:     Goals Addressed   None   Depression Screen PHQ 2/9 Scores 05/17/2021 04/16/2021 04/12/2019 02/28/2019  PHQ - 2 Score 0 0 0 0  PHQ- 9 Score 0 0 - -    Fall Risk Fall Risk  05/17/2021 04/16/2021 02/28/2019  Falls in the past year? 0 0 0  Number falls in past yr: 0 0 -  Injury with Fall? 1 0 -  Risk for fall due to : No Fall Risks - -  Follow up Education provided - -    FALL RISK PREVENTION PERTAINING TO THE HOME:  Any stairs in or around the home? No  If so, are there any without handrails? No  Home free of loose throw rugs in walkways, pet beds, electrical cords, etc? Yes  Adequate lighting in your home to reduce risk of falls? Yes   ASSISTIVE DEVICES UTILIZED TO PREVENT FALLS:  Life alert? No  Use of a cane, walker or w/c? No  Grab bars in the bathroom? No  Shower chair or bench in shower? No  Elevated toilet seat or a handicapped toilet? No   TIMED UP AND GO:  Was the test performed?  N/A .  Length of time to ambulate 10 feet: N/A sec.     Cognitive Function:       Immunizations  There is no immunization history on file for this patient.  TDAP status: Due, Education has been provided regarding the importance of this vaccine. Advised may receive this vaccine at local pharmacy or Health Dept. Aware to provide a copy of the vaccination record if obtained from local pharmacy or Health Dept. Verbalized acceptance and understanding.  Flu Vaccine status: Declined, Education has been provided regarding the importance of this vaccine but patient still declined. Advised may receive this vaccine at local pharmacy or Health Dept. Aware to provide a copy of the vaccination record if obtained from local pharmacy or Health Dept. Verbalized acceptance and understanding.  Pneumococcal vaccine status: Declined,  Education has been provided regarding the importance  of this vaccine but patient still declined. Advised may receive this vaccine at local pharmacy or Health Dept. Aware to provide a copy of the vaccination record if obtained from local pharmacy or Health Dept. Verbalized acceptance and understanding.   Covid-19 vaccine status: Completed vaccines  Qualifies for Shingles Vaccine? Yes   Zostavax completed No   Shingrix Completed?: No.    Education has been provided regarding the importance of this vaccine. Patient has been advised to call insurance company to determine out of pocket expense if they have not yet received this vaccine. Advised may also receive vaccine at local pharmacy or Health Dept. Verbalized acceptance and understanding.  Screening Tests Health Maintenance  Topic Date Due   COVID-19 Vaccine (1) Never done   Hepatitis C Screening  Never done   TETANUS/TDAP  Never done   PAP SMEAR-Modifier  Never done   COLONOSCOPY (Pts 45-86yrs Insurance coverage will need to be confirmed)  Never done   MAMMOGRAM  Never done   Zoster Vaccines- Shingrix (1 of 2) Never done   DEXA SCAN  Never done   INFLUENZA VACCINE  Never done   HIV Screening  Completed   HPV VACCINES  Aged Out    Health Maintenance  Health Maintenance Due  Topic Date Due   COVID-19 Vaccine (1) Never done   Hepatitis C Screening  Never done   TETANUS/TDAP  Never done   PAP SMEAR-Modifier  Never done   COLONOSCOPY (Pts 45-8yrs Insurance coverage will need to be confirmed)  Never done   MAMMOGRAM  Never done   Zoster Vaccines- Shingrix (1 of 2) Never done   DEXA SCAN  Never done   INFLUENZA VACCINE  Never done    Paient declined Colonoscopy Referral ,declined Cologuard  education provided  Patient declined order for mammogram Education providied.    Bone Density status: Ordered Patient Declined order f. Pt provided with contact info and advised to call to schedule appt.  Lung Cancer Screening: (Low Dose CT Chest recommended if Age 88-80 years, 30 pack-year  currently smoking OR have quit w/in 15years.) does qualify.  Lung Cancer Screening Referral: Declined order  Additional Screening:  Hepatitis C Screening: does qualify; Completed N/A  Vision Screening: Recommended annual ophthalmology exams for early detection of glaucoma and other disorders of the eye. Is the patient up to date with their annual eye exam?  No  Who is the provider or what is the name of the office in which the patient attends annual eye exams? N/A If pt is not established with a provider, would they like to be referred to a provider to establish care?  Patient will schedule appointment .   Dental Screening: Recommended annual dental exams for proper oral hygiene  Community Resource Referral / Chronic Care Management: CRR required this visit?  No   CCM required this visit?  No      Plan:     I have personally reviewed and noted the following in the patient's chart:   Medical and social history Use of alcohol, tobacco or illicit drugs  Current medications and supplements including opioid prescriptions. Patient is not currently taking opioid prescriptions. Functional ability and status Nutritional status Physical activity Advanced directives List of other physicians Hospitalizations, surgeries, and ER visits in previous 12 months Vitals Screenings to include cognitive, depression, and falls Referrals and appointments  In addition, I have reviewed and discussed with patient certain preventive protocols, quality metrics, and best practice recommendations. A written personalized care plan for preventive services as well as general preventive health recommendations were provided to patient.     Erick Alley, [facility]   05/17/2021   Nurse Notes: Non face to face 30 minutes   Ms. Delford Field , Thank you for taking time to come for your Medicare Wellness Visit. I appreciate your ongoing commitment to your health goals. Please review the following plan we discussed  and let me know if I can assist you in the future.   These are the goals we discussed:  Goals   None     This is a list of the screening recommended for you and due dates:  Health Maintenance  Topic Date Due   COVID-19 Vaccine (1) Never done   Hepatitis C Screening: USPSTF Recommendation to screen - Ages 66-79 yo.  Never done   Tetanus Vaccine  Never done   Pap Smear  Never done   Colon Cancer Screening  Never done   Mammogram  Never done   Zoster (Shingles) Vaccine (1 of 2) Never done   DEXA scan (bone density measurement)  Never done   Flu Shot  Never done   HIV Screening  Completed   HPV Vaccine  Aged Out

## 2021-05-17 NOTE — Patient Instructions (Signed)
Health Maintenance, Female Adopting a healthy lifestyle and getting preventive care are important in promoting health and wellness. Ask your health care provider about: The right schedule for you to have regular tests and exams. Things you can do on your own to prevent diseases and keep yourself healthy. What should I know about diet, weight, and exercise? Eat a healthy diet  Eat a diet that includes plenty of vegetables, fruits, low-fat dairy products, and lean protein. Do not eat a lot of foods that are high in solid fats, added sugars, or sodium. Maintain a healthy weight Body mass index (BMI) is used to identify weight problems. It estimates body fat based on height and weight. Your health care provider can help determine your BMI and help you achieve or maintain a healthy weight. Get regular exercise Get regular exercise. This is one of the most important things you can do for your health. Most adults should: Exercise for at least 150 minutes each week. The exercise should increase your heart rate and make you sweat (moderate-intensity exercise). Do strengthening exercises at least twice a week. This is in addition to the moderate-intensity exercise. Spend less time sitting. Even light physical activity can be beneficial. Watch cholesterol and blood lipids Have your blood tested for lipids and cholesterol at 65 years of age, then have this test every 5 years. Have your cholesterol levels checked more often if: Your lipid or cholesterol levels are high. You are older than 65 years of age. You are at high risk for heart disease. What should I know about cancer screening? Depending on your health history and family history, you may need to have cancer screening at various ages. This may include screening for: Breast cancer. Cervical cancer. Colorectal cancer. Skin cancer. Lung cancer. What should I know about heart disease, diabetes, and high blood pressure? Blood pressure and heart  disease High blood pressure causes heart disease and increases the risk of stroke. This is more likely to develop in people who have high blood pressure readings, are of African descent, or are overweight. Have your blood pressure checked: Every 3-5 years if you are 18-39 years of age. Every year if you are 40 years old or older. Diabetes Have regular diabetes screenings. This checks your fasting blood sugar level. Have the screening done: Once every three years after age 40 if you are at a normal weight and have a low risk for diabetes. More often and at a younger age if you are overweight or have a high risk for diabetes. What should I know about preventing infection? Hepatitis B If you have a higher risk for hepatitis B, you should be screened for this virus. Talk with your health care provider to find out if you are at risk for hepatitis B infection. Hepatitis C Testing is recommended for: Everyone born from 1945 through 1965. Anyone with known risk factors for hepatitis C. Sexually transmitted infections (STIs) Get screened for STIs, including gonorrhea and chlamydia, if: You are sexually active and are younger than 65 years of age. You are older than 65 years of age and your health care provider tells you that you are at risk for this type of infection. Your sexual activity has changed since you were last screened, and you are at increased risk for chlamydia or gonorrhea. Ask your health care provider if you are at risk. Ask your health care provider about whether you are at high risk for HIV. Your health care provider may recommend a prescription medicine   to help prevent HIV infection. If you choose to take medicine to prevent HIV, you should first get tested for HIV. You should then be tested every 3 months for as long as you are taking the medicine. Pregnancy If you are about to stop having your period (premenopausal) and you may become pregnant, seek counseling before you get  pregnant. Take 400 to 800 micrograms (mcg) of folic acid every day if you become pregnant. Ask for birth control (contraception) if you want to prevent pregnancy. Osteoporosis and menopause Osteoporosis is a disease in which the bones lose minerals and strength with aging. This can result in bone fractures. If you are 65 years old or older, or if you are at risk for osteoporosis and fractures, ask your health care provider if you should: Be screened for bone loss. Take a calcium or vitamin D supplement to lower your risk of fractures. Be given hormone replacement therapy (HRT) to treat symptoms of menopause. Follow these instructions at home: Lifestyle Do not use any products that contain nicotine or tobacco, such as cigarettes, e-cigarettes, and chewing tobacco. If you need help quitting, ask your health care provider. Do not use street drugs. Do not share needles. Ask your health care provider for help if you need support or information about quitting drugs. Alcohol use Do not drink alcohol if: Your health care provider tells you not to drink. You are pregnant, may be pregnant, or are planning to become pregnant. If you drink alcohol: Limit how much you use to 0-1 drink a day. Limit intake if you are breastfeeding. Be aware of how much alcohol is in your drink. In the [place] one drink equals one 12 oz bottle of beer (355 mL), one 5 oz glass of wine (148 mL), or one 1 oz glass of hard liquor (44 mL). General instructions Schedule regular health, dental, and eye exams. Stay current with your vaccines. Tell your health care provider if: You often feel depressed. You have ever been abused or do not feel safe at home. Summary Adopting a healthy lifestyle and getting preventive care are important in promoting health and wellness. Follow your health care provider's instructions about healthy diet, exercising, and getting tested or screened for diseases. Follow your health care provider's  instructions on monitoring your cholesterol and blood pressure. This information is not intended to replace advice given to you by your health care provider. Make sure you discuss any questions you have with your health care provider. Document Revised: 10/24/2020 Document Reviewed: 08/09/2018 Elsevier Patient Education  2022 [employer]  

## 2021-06-23 ENCOUNTER — Other Ambulatory Visit: Payer: Self-pay

## 2021-07-13 ENCOUNTER — Other Ambulatory Visit: Payer: Self-pay

## 2021-07-14 ENCOUNTER — Other Ambulatory Visit: Payer: Self-pay

## 2021-07-15 ENCOUNTER — Other Ambulatory Visit: Payer: Self-pay

## 2021-10-12 ENCOUNTER — Other Ambulatory Visit: Payer: Self-pay

## 2021-10-14 ENCOUNTER — Other Ambulatory Visit: Payer: Self-pay

## 2021-10-19 ENCOUNTER — Ambulatory Visit: Payer: Medicare Other | Admitting: Internal Medicine

## 2021-11-12 ENCOUNTER — Other Ambulatory Visit: Payer: Self-pay

## 2021-11-12 ENCOUNTER — Encounter: Payer: Self-pay | Admitting: Physician Assistant

## 2021-11-12 ENCOUNTER — Ambulatory Visit: Payer: 59 | Attending: Internal Medicine | Admitting: Physician Assistant

## 2021-11-12 VITALS — BP 134/78 | HR 73 | Resp 18 | Ht 64.0 in | Wt 233.4 lb

## 2021-11-12 DIAGNOSIS — L732 Hidradenitis suppurativa: Secondary | ICD-10-CM

## 2021-11-12 DIAGNOSIS — Z1231 Encounter for screening mammogram for malignant neoplasm of breast: Secondary | ICD-10-CM | POA: Diagnosis not present

## 2021-11-12 DIAGNOSIS — I10 Essential (primary) hypertension: Secondary | ICD-10-CM

## 2021-11-12 DIAGNOSIS — E7800 Pure hypercholesterolemia, unspecified: Secondary | ICD-10-CM | POA: Diagnosis not present

## 2021-11-12 DIAGNOSIS — H259 Unspecified age-related cataract: Secondary | ICD-10-CM

## 2021-11-12 DIAGNOSIS — Z1211 Encounter for screening for malignant neoplasm of colon: Secondary | ICD-10-CM

## 2021-11-12 DIAGNOSIS — K089 Disorder of teeth and supporting structures, unspecified: Secondary | ICD-10-CM

## 2021-11-12 DIAGNOSIS — R7303 Prediabetes: Secondary | ICD-10-CM

## 2021-11-12 MED ORDER — MUPIROCIN 2 % EX OINT
1.0000 "application " | TOPICAL_OINTMENT | Freq: Two times a day (BID) | CUTANEOUS | 1 refills | Status: DC
  Filled 2021-11-12: qty 22, 11d supply, fill #0

## 2021-11-12 MED ORDER — ATORVASTATIN CALCIUM 80 MG PO TABS
80.0000 mg | ORAL_TABLET | Freq: Every day | ORAL | 1 refills | Status: DC
Start: 2021-11-12 — End: 2022-05-24
  Filled 2021-11-12 – 2022-01-04 (×2): qty 90, 90d supply, fill #0
  Filled 2022-04-06: qty 90, 90d supply, fill #1

## 2021-11-12 NOTE — Patient Instructions (Signed)
Hidradenitis Suppurativa Hidradenitis suppurativa is a long-term (chronic) skin disease. It is similar to a severe form of acne, but it affects areas of the body where acne would be unusual, especially areas of the body where skin rubs against skin and becomes moist. These include: Underarms. Groin. Genital area. Buttocks. Upper thighs. Breasts. Hidradenitis suppurativa may start out as small lumps or pimples caused by blocked sweat glands or hair follicles. Pimples may develop into deep sores that break open (rupture) and drain pus. Over time, affected areas of skin may thicken and become scarred. This condition is rare and does not spread from person to person (non-contagious). What are the causes? The exact cause of this condition is not known. It may be related to: Female and female hormones. An overactive disease-fighting system (immune system). The immune system may over-react to blocked hair follicles or sweat glands and cause swelling and pus-filled sores. What increases the risk? You are more likely to develop this condition if you: Are female. Are 11-55 years old. Have a family history of hidradenitis suppurativa. Have a personal history of acne. Are overweight. Smoke. Take the medicine lithium. What are the signs or symptoms? The first symptoms are usually painful bumps in the skin, similar to pimples. The condition may get worse over time (progress), or it may only cause mild symptoms. If the disease progresses, symptoms may include: Skin bumps getting bigger and growing deeper into the skin. Bumps rupturing and draining pus. Itchy, infected skin. Skin getting thicker and scarred. Tunnels under the skin (fistulas) where pus drains from a bump. Pain during daily activities, such as pain during walking if your groin area is affected. Emotional problems, such as stress or depression. This condition may affect your appearance and your ability or willingness to wear certain clothes  or do certain activities. How is this diagnosed? This condition is diagnosed by a health care provider who specializes in skin diseases (dermatologist). You may be diagnosed based on: Your symptoms and medical history. A physical exam. Testing a pus sample for infection. Blood tests. How is this treated? Your treatment will depend on how severe your symptoms are. The same treatment will not work for everybody with this condition. You may need to try several treatments to find what works best for you. Treatment may include: Cleaning and bandaging (dressing) your wounds as needed. Lifestyle changes, such as new skin care routines. Taking medicines, such as: Antibiotics. Acne medicines. Medicines to reduce the activity of the immune system. A diabetes medicine (metformin). Birth control pills, for women. Steroids to reduce swelling and pain. Working with a mental health care provider, if you experience emotional distress due to this condition. If you have severe symptoms that do not get better with medicine, you may need surgery. Surgery may involve: Using a laser to clear the skin and remove hair follicles. Opening and draining deep sores. Removing the areas of skin that are diseased and scarred. Follow these instructions at home: Medicines  Take over-the-counter and prescription medicines only as told by your health care provider. If you were prescribed an antibiotic medicine, take it as told by your health care provider. Do not stop taking the antibiotic even if your condition improves. Skin care If you have open wounds, cover them with a clean dressing as told by your health care provider. Keep wounds clean by washing them gently with soap and water when you bathe. Do not shave the areas where you get hidradenitis suppurativa. Do not wear deodorant. Wear loose-fitting   clothes. Try to avoid getting overheated or sweaty. If you get sweaty or wet, change into clean, dry clothes as soon  as you can. To help relieve pain and itchiness, cover sore areas with a warm, clean washcloth (warm compress) for 5-10 minutes as often as needed. If told by your health care provider, take a bleach bath twice a week: Fill your bathtub halfway with water. Pour in  cup of unscented household bleach. Soak in the tub for 5-10 minutes. Only soak from the neck down. Avoid water on your face and hair. Shower to rinse off the bleach from your skin. General instructions Learn as much as you can about your disease so that you have an active role in your treatment. Work closely with your health care provider to find treatments that work for you. If you are overweight, work with your health care provider to lose weight as recommended. Do not use any products that contain nicotine or tobacco, such as cigarettes and e-cigarettes. If you need help quitting, ask your health care provider. If you struggle with living with this condition, talk with your health care provider or work with a mental health care provider as recommended. Keep all follow-up visits as told by your health care provider. This is important. Where to find more information Hidradenitis Suppurativa Foundation, Inc.: https://www.hs-foundation.org/ American Academy of Dermatology: https://www.aad.org Contact a health care provider if you have: A flare-up of hidradenitis suppurativa. A fever or chills. Trouble controlling your symptoms at home. Trouble doing your daily activities because of your symptoms. Trouble dealing with emotional problems related to your condition. Summary Hidradenitis suppurativa is a long-term (chronic) skin disease. It is similar to a severe form of acne, but it affects areas of the body where acne would be unusual. The first symptoms are usually painful bumps in the skin, similar to pimples. The condition may only cause mild symptoms, or it may get worse over time (progress). If you have open wounds, cover them  with a clean dressing as told by your health care provider. Keep wounds clean by washing them gently with soap and water when you bathe. Besides skin care, treatment may include medicines, laser treatment, and surgery. This information is not intended to replace advice given to you by your health care provider. Make sure you discuss any questions you have with your health care provider. Document Revised: 06/10/2020 Document Reviewed: 06/10/2020 Elsevier Patient Education  2022 [employer]vier Inc.  

## 2021-11-12 NOTE — Progress Notes (Signed)
Patient ID: Sheila Webster, female   DOB: [birth year], 66 y.o.   MRN: [id number] ? ? ?Sheila Webster, is a 66 y.o. female ? ?[id number] ? ?EHU:[id number] ? ?DOB - 09/29/1955 ? ?Chief Complaint  ?Patient presents with  ? Hypertension  ?    ? ?Subjective:  ? ?Sheila Webster is a 66 y.o. female here today for and med RF and needs meds for recurrent boils.  Also, was seen at [facility] for eye exam and was told she has cataracts and needs to see an ophthalmologist.  She also has poor dentition and wants to see about seeing a dentist.   ? ? ?No problems updated. ? ?ALLERGIES: ?No Known Allergies ? ?PAST MEDICAL HISTORY: ?Past Medical History:  ?Diagnosis Date  ? Hypertension   ? Irregular heartbeat   ? ? ?MEDICATIONS AT HOME: ?Prior to Admission medications   ?Medication Sig Start Date End Date Taking? Authorizing Provider  ?amLODipine (NORVASC) 5 MG tablet Take 1 tablet (5 mg total) by mouth daily. 04/16/21  Yes Anders Simmonds, PA-C  ?aspirin 81 MG chewable tablet Chew 1 tablet (81 mg total) by mouth daily. 02/20/19  Yes Garnette Gunner, MD  ?mupirocin ointment (BACTROBAN) 2 % Apply 1 application. topically 2 (two) times daily. X 7 days prn boils 11/12/21  Yes Amariya Liskey, Marzella Schlein, PA-C  ?APPLE CIDER VINEGAR PO Take 450 mg by mouth 3 (three) times daily. ?Patient not taking: Reported on 11/12/2021    [provider]  ?atorvastatin (LIPITOR) 80 MG tablet Take 1 tablet (80 mg total) by mouth daily at 6 PM. 11/12/21   Anders Simmonds, PA-C  ? ? ?ROS: ?Neg HEENT ?Neg resp ?Neg cardiac ?Neg GI ?Neg GU ?Neg MS ?Neg psych ?Neg neuro ? ?Objective:  ? ?Vitals:  ? 11/12/21 0913  ?BP: 134/78  ?Pulse: 73  ?Resp: 18  ?SpO2: 98%  ?Weight: 233 lb 6 oz (105.9 kg)  ?Height: 5\' 4"  (1.626 m)  ? ?Exam ?General appearance : Awake, alert, not in any distress. Speech Clear. Not toxic looking ?HEENT: Atraumatic and Normocephalic ?Chest: Good air entry bilaterally, CTAB.  No rales/rhonchi/wheezing ?CVS: S1 S2 regular, no murmurs.  ?Axillary  scarring from [facility].  No active boils today ?Extremities: B/L Lower Ext shows no edema, both legs are warm to touch ?Neurology: Awake alert, and oriented X 3, CN II-XII intact, Non focal ?Skin: No Rash ? ?Data Review ?Lab Results  ?Component Value Date  ? HGBA1C 5.8 (H) 04/16/2021  ? HGBA1C 5.9 (H) 02/17/2019  ? ? ?Assessment & Plan  ? ?1. Hidradenitis suppurativa ?- mupirocin ointment (BACTROBAN) 2 %; Apply 1 application. topically 2 (two) times daily. X 7 days prn boils  Dispense: 30 g; Refill: 1 ?- CBC with Differential/Platelet ? ?2. Pure hypercholesterolemia ?- atorvastatin (LIPITOR) 80 MG tablet; Take 1 tablet (80 mg total) by mouth daily at 6 PM.  Dispense: 90 tablet; Refill: 1 ?- Comprehensive metabolic panel ? ?3. Colon cancer screening ?- Ambulatory referral to Gastroenterology ? ?4. Breast cancer screening by mammogram ?- MM DIGITAL SCREENING BILATERAL; Future ? ?5. Poor dentition ?- Ambulatory referral to Dentistry ? ?6. Age-related cataract of both eyes, unspecified age-related cataract type ?- Ambulatory referral to Ophthalmology ? ?7. Essential hypertension ?She does not need RF today but please allow RF for 6 months ?- Ambulatory referral to Ophthalmology ?- Comprehensive metabolic panel ? ?8. Prediabetes ?I have had a lengthy discussion and provided education about insulin resistance and the intake of too much sugar/refined carbohydrates.  I have advised the patient to work at a goal of eliminating sugary drinks, candy, desserts, sweets, refined sugars, processed foods, and white carbohydrates.  The patient expresses understanding.  ? ?- HgB A1c ? ? ? ?Patient have been counseled extensively about nutrition and exercise. Other issues discussed during this visit include: low cholesterol diet, weight control and daily exercise, foot care, annual eye examinations at Ophthalmology, importance of adherence with medications and regular follow-up. We also discussed long term complications of uncontrolled  diabetes and hypertension.  ? ?Return in about 6 months (around 05/15/2022) for PCP-chronic conditions. ? ?The patient was given clear instructions to go to ER or return to medical center if symptoms don't improve, worsen or new problems develop. The patient verbalized understanding. The patient was told to call to get lab results if they haven't heard anything in the next week.  ? ? ? ? ?Georgian Co, PA-C ?[facility] ?[place], [place] ?[phone]   ?11/12/2021, 9:36 AM  ?

## 2021-11-13 LAB — COMPREHENSIVE METABOLIC PANEL
ALT: 22 IU/L (ref 0–32)
AST: 15 IU/L (ref 0–40)
Albumin/Globulin Ratio: 1.6 (ref 1.2–2.2)
Albumin: 4.1 g/dL (ref 3.8–4.8)
Alkaline Phosphatase: 110 IU/L (ref 44–121)
BUN/Creatinine Ratio: 15 (ref 12–28)
BUN: 11 mg/dL (ref 8–27)
Bilirubin Total: 0.2 mg/dL (ref 0.0–1.2)
CO2: 23 mmol/L (ref 20–29)
Calcium: 9 mg/dL (ref 8.7–10.3)
Chloride: 103 mmol/L (ref 96–106)
Creatinine, Ser: 0.73 mg/dL (ref 0.57–1.00)
Globulin, Total: 2.6 g/dL (ref 1.5–4.5)
Glucose: 91 mg/dL (ref 70–99)
Potassium: 4.3 mmol/L (ref 3.5–5.2)
Sodium: 139 mmol/L (ref 134–144)
Total Protein: 6.7 g/dL (ref 6.0–8.5)
eGFR: 91 mL/min/{1.73_m2} (ref >59)

## 2021-11-13 LAB — CBC WITH DIFFERENTIAL/PLATELET
Basophils Absolute: 0.1 10*3/uL (ref 0.0–0.2)
Basos: 1 %
EOS (ABSOLUTE): 0.2 10*3/uL (ref 0.0–0.4)
Eos: 2 %
Hematocrit: 39.1 % (ref 34.0–46.6)
Hemoglobin: 12.7 g/dL (ref 11.1–15.9)
Immature Grans (Abs): 0.1 10*3/uL (ref 0.0–0.1)
Immature Granulocytes: 1 %
Lymphocytes Absolute: 2.1 10*3/uL (ref 0.7–3.1)
Lymphs: 18 %
MCH: 25.8 pg — ABNORMAL LOW (ref 26.6–33.0)
MCHC: 32.5 g/dL (ref 31.5–35.7)
MCV: 79 fL (ref 79–97)
Monocytes Absolute: 0.6 10*3/uL (ref 0.1–0.9)
Monocytes: 5 %
Neutrophils Absolute: 8.7 10*3/uL — ABNORMAL HIGH (ref 1.4–7.0)
Neutrophils: 73 %
Platelets: 362 10*3/uL (ref 150–450)
RBC: 4.93 x10E6/uL (ref 3.77–5.28)
RDW: 13.9 % (ref 11.7–15.4)
WBC: 11.8 10*3/uL — ABNORMAL HIGH (ref 3.4–10.8)

## 2022-01-03 ENCOUNTER — Ambulatory Visit (HOSPITAL_COMMUNITY)
Admission: EM | Admit: 2022-01-03 | Discharge: 2022-01-03 | Disposition: A | Discharge status: Home / Self Care | Payer: 59 | Attending: Emergency Medicine | Admitting: Emergency Medicine

## 2022-01-03 ENCOUNTER — Encounter (HOSPITAL_COMMUNITY): Payer: Self-pay

## 2022-01-03 DIAGNOSIS — L03012 Cellulitis of left finger: Secondary | ICD-10-CM | POA: Diagnosis not present

## 2022-01-03 MED ORDER — CEPHALEXIN 500 MG PO CAPS
500.0000 mg | ORAL_CAPSULE | Freq: Four times a day (QID) | ORAL | 0 refills | Status: DC
  Filled 2022-01-03: qty 20, 5d supply, fill #0

## 2022-01-03 MED ORDER — MUPIROCIN CALCIUM 2 % EX CREA: 1.0000 "application " | TOPICAL_CREAM | Freq: Two times a day (BID) | CUTANEOUS | 0 refills | Status: DC

## 2022-01-03 MED ORDER — CEPHALEXIN 500 MG PO CAPS: 500.0000 mg | ORAL_CAPSULE | Freq: Four times a day (QID) | ORAL | 0 refills | Status: DC

## 2022-01-03 MED ORDER — MUPIROCIN CALCIUM 2 % EX CREA
1.0000 "application " | TOPICAL_CREAM | Freq: Two times a day (BID) | CUTANEOUS | 0 refills | Status: DC
  Filled 2022-01-03: qty 15, 8d supply, fill #0

## 2022-01-03 NOTE — ED Provider Notes (Signed)
?[facility] ? ? ?[id number] ?01/03/22 Arrival Time: 1044 ? ? ?Chief Complaint  ?Patient presents with  ? Finger Injury  ? ? ? ?SUBJECTIVE: ?History from: patient and family. ? ?MYKIA HOLTON is a 66 y.o. female presenting to the urgent care for complaint of left thumb swelling with draining pus and pain around nails.  Denies any precipitating event.  Reports she wakes up with the symptoms.  Symptoms are made worse with ROM.  Denies similar symptoms in the past.  Denies fever, chills, fatigue, nausea, vomiting and diarrhea. ? ? ?ROS: As per HPI.  All other pertinent ROS negative.    ? ? ?Past Medical History:  ?Diagnosis Date  ? Hypertension   ? Irregular heartbeat   ? ?Past Surgical History:  ?Procedure Laterality Date  ? BREAST SURGERY    ? KNEE SURGERY Right   ? ?No Known Allergies ?No current facility-administered medications on file prior to encounter.  ? ?Current Outpatient Medications on File Prior to Encounter  ?Medication Sig Dispense Refill  ? amLODipine (NORVASC) 5 MG tablet Take 1 tablet (5 mg total) by mouth daily. 90 tablet 3  ? aspirin 81 MG chewable tablet Chew 1 tablet (81 mg total) by mouth daily. 30 tablet 11  ? atorvastatin (LIPITOR) 80 MG tablet Take 1 tablet (80 mg total) by mouth daily at 6 PM. 90 tablet 1  ? APPLE CIDER VINEGAR PO Take 450 mg by mouth 3 (three) times daily. (Patient not taking: Reported on 11/12/2021)    ? ?Social History  ? ?Socioeconomic History  ? Marital status: Single  ?  Spouse name: Not on file  ? Number of children: Not on file  ? Years of education: Not on file  ? Highest education level: Not on file  ?Occupational History  ? Occupation: Unemployed  ?Tobacco Use  ? Smoking status: Every Day  ?  Packs/day: 0.25  ?  Types: Cigarettes  ? Smokeless tobacco: Never  ?Vaping Use  ? Vaping Use: Never used  ?Substance and Sexual Activity  ? Alcohol use: No  ? Drug use: No  ? Sexual activity: Yes  ?  Birth control/protection: Post-menopausal  ?Other Topics Concern  ?  Not on file  ?Social History Narrative  ? Not on file  ? ?Social Determinants of Health  ? ?Financial Resource Strain: Not on file  ?Food Insecurity: No Food Insecurity  ? Worried About Programme researcher, broadcasting/film/video in the Last Year: Never true  ? Ran Out of Food in the Last Year: Never true  ?Transportation Needs: No Transportation Needs  ? Lack of Transportation (Medical): No  ? Lack of Transportation (Non-Medical): No  ?Physical Activity: Insufficiently Active  ? Days of Exercise per Week: 3 days  ? Minutes of Exercise per Session: 30 min  ?Stress: Not on file  ?Social Connections: Not on file  ?Intimate Partner Violence: Not on file  ? ?Family History  ?Problem Relation Age of Onset  ? Cancer Mother   ? Lymphoma Sister   ? ? ?OBJECTIVE: ? ?Vitals:  ? 01/03/22 1220  ?BP: 132/82  ?Pulse: (!) 53  ?Resp: 18  ?Temp: 98.5 ?F (36.9 ?C)  ?TempSrc: Oral  ?SpO2: 98%  ?  ? ?Physical Exam ?Vitals and nursing note reviewed.  ?Constitutional:   ?   General: She is not in acute distress. ?   Appearance: Normal appearance. She is normal weight. She is not ill-appearing, toxic-appearing or diaphoretic.  ?HENT:  ?   Head: Normocephalic.  ?  Cardiovascular:  ?   Rate and Rhythm: Normal rate and regular rhythm.  ?   Pulses: Normal pulses.  ?   Heart sounds: Normal heart sounds. No murmur heard. ?  No friction rub. No gallop.  ?Pulmonary:  ?   Effort: Pulmonary effort is normal. No respiratory distress.  ?   Breath sounds: Normal breath sounds. No stridor. No wheezing, rhonchi or rales.  ?Chest:  ?   Chest wall: No tenderness.  ?Skin: ?   Findings: Erythema present.  ?   Comments: Paronychia present on the left thumb with swelling and redness  ?Neurological:  ?   Mental Status: She is alert and oriented to person, place, and time.  ?  ? ?LABS: ? ?No results found for this or any previous visit (from the past 24 hour(s)).  ? ?ASSESSMENT & PLAN: ? ?1. Paronychia of left thumb   ? ? ?Meds ordered this encounter  ?Medications  ? mupirocin cream  (BACTROBAN) 2 %  ?  Sig: Apply 1 application. topically 2 (two) times daily.  ?  Dispense:  15 g  ?  Refill:  0  ? cephALEXin (KEFLEX) 500 MG capsule  ?  Sig: Take 1 capsule (500 mg total) by mouth 4 (four) times daily.  ?  Dispense:  20 capsule  ?  Refill:  0  ? ?PROCEDURE  ? ?Verbal consent obtained. Area cleansed with iodine. Biofreeze used for patient comfort. Small incision using a 11 blade scapel made over the most fluctuant portion of the abscess.  Bloody purulent drainage expressed.  Patient tolerated procedure well.  Minimal bleeding.    ? ? ?Discharge instructions ? ?Incision and drainage performed ?Perform frequent warm soak to help facilitate drainage ?Wash daily with warm water and mild soap ?Keep covered to avoid secondary infection ?Keflex was prescribed ?Bactroban cream prescribed to help present infection ?Use OTC ibuprofen/tylenol as needed for pain  ?Return or go to the ED if you have any new or worsening symptoms such as worsening toe pain, nausea, vomiting, increased redness, swelling, fever, chills, etc...  ? ?Reviewed expectations re: course of current medical issues. Questions answered. ?Outlined signs and symptoms indicating need for more acute intervention. ?Patient verbalized understanding. ?After Visit Summary given. ? ? ? ? ? ?  ?  ?Durward Parcel, FNP ?01/03/22 1431 ? ?

## 2022-01-03 NOTE — Discharge Instructions (Addendum)
Incision and drainage performed ?Perform frequent warm soak to help facilitate drainage ?Wash daily with warm water and mild soap ?Keep covered to avoid secondary infection ?Keflex was prescribed ?Bactroban cream prescribed to help present infection ?Use OTC ibuprofen/tylenol as needed for pain  ?Return or go to the ED if you have any new or worsening symptoms such as worsening toe pain, nausea, vomiting, increased redness, swelling, fever, chills, etc.. ?

## 2022-01-03 NOTE — ED Triage Notes (Signed)
Pt has swelling in her left thumb. Denies injury. Pt states that she has been soaking her finger in Epson salt and did have some pus come out. ?

## 2022-01-04 ENCOUNTER — Other Ambulatory Visit: Payer: Self-pay

## 2022-01-08 ENCOUNTER — Other Ambulatory Visit: Payer: Self-pay

## 2022-03-30 ENCOUNTER — Other Ambulatory Visit: Payer: Self-pay

## 2022-03-30 ENCOUNTER — Other Ambulatory Visit: Payer: Self-pay | Admitting: Physician Assistant

## 2022-03-30 DIAGNOSIS — I10 Essential (primary) hypertension: Secondary | ICD-10-CM

## 2022-03-31 ENCOUNTER — Other Ambulatory Visit: Payer: Self-pay

## 2022-03-31 MED ORDER — AMLODIPINE BESYLATE 5 MG PO TABS
5.0000 mg | ORAL_TABLET | Freq: Every day | ORAL | 0 refills | Status: DC
  Filled 2022-03-31: qty 90, 90d supply, fill #0

## 2022-04-06 ENCOUNTER — Other Ambulatory Visit: Payer: Self-pay

## 2022-05-24 ENCOUNTER — Ambulatory Visit: Payer: Medicare Other | Attending: Internal Medicine | Admitting: Internal Medicine

## 2022-05-24 ENCOUNTER — Other Ambulatory Visit: Payer: Self-pay

## 2022-05-24 ENCOUNTER — Encounter: Payer: Self-pay | Admitting: Internal Medicine

## 2022-05-24 VITALS — BP 138/77 | HR 72 | Temp 98.2°F | Ht 64.0 in | Wt 230.8 lb

## 2022-05-24 DIAGNOSIS — E7800 Pure hypercholesterolemia, unspecified: Secondary | ICD-10-CM | POA: Diagnosis not present

## 2022-05-24 DIAGNOSIS — R7303 Prediabetes: Secondary | ICD-10-CM

## 2022-05-24 DIAGNOSIS — Z2821 Immunization not carried out because of patient refusal: Secondary | ICD-10-CM

## 2022-05-24 DIAGNOSIS — Z8673 Personal history of transient ischemic attack (TIA), and cerebral infarction without residual deficits: Secondary | ICD-10-CM

## 2022-05-24 DIAGNOSIS — I10 Essential (primary) hypertension: Secondary | ICD-10-CM

## 2022-05-24 DIAGNOSIS — F17210 Nicotine dependence, cigarettes, uncomplicated: Secondary | ICD-10-CM

## 2022-05-24 DIAGNOSIS — Z5320 Procedure and treatment not carried out because of patient's decision for unspecified reasons: Secondary | ICD-10-CM

## 2022-05-24 DIAGNOSIS — Z23 Encounter for immunization: Secondary | ICD-10-CM | POA: Diagnosis not present

## 2022-05-24 DIAGNOSIS — F17200 Nicotine dependence, unspecified, uncomplicated: Secondary | ICD-10-CM

## 2022-05-24 DIAGNOSIS — Z6839 Body mass index (BMI) 39.0-39.9, adult: Secondary | ICD-10-CM

## 2022-05-24 MED ORDER — ATORVASTATIN CALCIUM 80 MG PO TABS
80.0000 mg | ORAL_TABLET | Freq: Every day | ORAL | 1 refills | Status: DC
  Filled 2022-05-24 – 2022-07-02 (×2): qty 90, 90d supply, fill #0

## 2022-05-24 MED ORDER — NICOTINE POLACRILEX 2 MG MT GUM
2.0000 mg | CHEWING_GUM | OROMUCOSAL | 1 refills | Status: DC | PRN
  Filled 2022-05-24: qty 50, 50d supply, fill #0

## 2022-05-24 MED ORDER — AMLODIPINE BESYLATE 10 MG PO TABS
10.0000 mg | ORAL_TABLET | Freq: Every day | ORAL | 1 refills | Status: DC
  Filled 2022-05-24: qty 90, 90d supply, fill #0
  Filled 2022-09-01 – 2022-09-09 (×3): qty 90, 90d supply, fill #1

## 2022-05-24 MED ORDER — ASPIRIN 81 MG PO CHEW
81.0000 mg | CHEWABLE_TABLET | Freq: Every day | ORAL | 11 refills | Status: DC
  Filled 2022-05-24: qty 30, 30d supply, fill #0

## 2022-05-24 NOTE — Progress Notes (Signed)
Patient ID: AREIL OTTEY, female    DOB: 05/29/1956  MRN: [id number]  CC: Hypertension   Subjective: Sheila Webster is a 66 y.o. female who presents for chronic ds management.  Last saw me 03/2019 Her concerns today include:  Patient with history of CVA (LT mid corona radiata infarct), tobacco dependence, HTN, hidradenitis  HYPERTENSION Currently taking: see medication list.  Took Norvasc 5 mg already this a.m Med Adherence: [x]  Yes    []  No Medication side effects: []  Yes    [x]  No Adherence with salt restriction: [x]  Yes    []  No Home Monitoring?: [x]  Yes    []  No Monitoring Frequency:  daily Home BP results range: last 3 days, BP has been normal since taking Apple Cider Vinagar pill -125/83, 137/80s.  This a.m was 145/89 SOB? []  Yes    [x]  No Chest Pain?: []  Yes    [x]  No Leg swelling?: []  Yes    [x]  No Headaches?: []  Yes    [x]  No Dizziness? []  Yes    [x]  No Comments:   Tob dep:  "I need to quit but I'm so bored."  Feels like she can quit.  Smoking 1/2 pk a day.  Would like to try nicotine gum.  Hx of CVA.  Taking Lipitor and ASA daily  Obese/PreDM: has cut back on fried foods and sugar.  "My problem is I snack all day and wake up through the night to eat."  Snacks on carrots -Not getting in much exercise.    HM:  Needs flu vaccine and Tdapt.  Had Shingles 1st shot 2.5 mths ago at on and .   Declines MMG and any and all colon CA screen.  Declines DEXA.    Patient Active Problem List   Diagnosis Date Noted   Hidradenitis suppurativa 04/13/2019   Obesity (BMI 35.0-39.9 without comorbidity) 04/12/2019   Tobacco dependence 04/12/2019   History of CVA (cerebrovascular accident) without residual deficits 04/12/2019   Pure hypercholesterolemia    CVA (cerebral vascular accident) (HCC) 02/17/2019   Essential hypertension    Tobacco use      Current Outpatient Medications on File Prior to Visit  Medication Sig Dispense Refill   APPLE CIDER  VINEGAR PO Take 450 mg by mouth 3 (three) times daily.     mupirocin cream (BACTROBAN) 2 % Apply 1 application. topically 2 (two) times daily. 15 g 0   No current facility-administered medications on file prior to visit.    No Known Allergies  Social History   Socioeconomic History   Marital status: Single    Spouse name: Not on file   Number of children: Not on file   Years of education: Not on file   Highest education level: Not on file  Occupational History   Occupation: Unemployed  Tobacco Use   Smoking status: Every Day    Packs/day: 0.25    Types: Cigarettes   Smokeless tobacco: Never  Vaping Use   Vaping Use: Never used  Substance and Sexual Activity   Alcohol use: No   Drug use: No   Sexual activity: Yes    Birth control/protection: Post-menopausal  Other Topics Concern   Not on file  Social History Narrative   Not on file   Social Determinants of Health   Financial Resource Strain: Not on file  Food Insecurity: No Food Insecurity (05/17/2021)   Hunger Vital Sign    Worried About Running Out of Food in the Last Year:  Never true    Ran Out of Food in the Last Year: Never true  Transportation Needs: No Transportation Needs (05/17/2021)   PRAPARE - Administrator, Civil Service (Medical): No    Lack of Transportation (Non-Medical): No  Physical Activity: Insufficiently Active (05/17/2021)   Exercise Vital Sign    Days of Exercise per Week: 3 days    Minutes of Exercise per Session: 30 min  Stress: Not on file  Social Connections: Not on file  Intimate Partner Violence: Not on file    Family History  Problem Relation Age of Onset   Cancer Mother    Lymphoma Sister     Past Surgical History:  Procedure Laterality Date   BREAST SURGERY     KNEE SURGERY Right     ROS: Review of Systems Negative except as stated above  PHYSICAL EXAM: BP 138/77   Pulse 72   Temp 98.2 F (36.8 C) (Oral)   Ht 5\' 4"  (1.626 m)   Wt 230 lb 12.8 oz (104.7  kg)   SpO2 96%   BMI 39.62 kg/m   Wt Readings from Last 3 Encounters:  05/24/22 230 lb 12.8 oz (104.7 kg)  11/12/21 233 lb 6 oz (105.9 kg)  04/16/21 227 lb (103 kg)    Physical Exam  General appearance - alert, well appearing, older obese African-American female and in no distress Mental status - normal mood, behavior, speech, dress, motor activity, and thought processes Neck - supple, no significant adenopathy Chest - clear to auscultation, no wheezes, rales or rhonchi, symmetric air entry Heart - normal rate, regular rhythm, normal S1, S2, no murmurs, rubs, clicks or gallops Extremities - peripheral pulses normal, no pedal edema, no clubbing or cyanosis      Latest Ref Rng & Units 11/12/2021    9:48 AM 04/16/2021    3:31 PM 02/18/2019   11:23 AM  CMP  Glucose 70 - 99 mg/dL 91  97  02/20/2019   BUN 8 - 27 mg/dL 11  14  13    Creatinine 0.57 - 1.00 mg/dL 176   1.60   Sodium 134 - 144 mmol/L 139  138  139   Potassium 3.5 - 5.2 mmol/L 4.3  4.5  3.7   Chloride 96 - 106 mmol/L 103  103  108   CO2 20 - 29 mmol/L 23  24  24    Calcium 8.7 - 10.3 mg/dL 9.0  9.2  8.4   Total Protein 6.0 - 8.5 g/dL 6.7  7.0    Total Bilirubin 0.0 - 1.2 mg/dL 0.2  7.37    Alkaline Phos 44 - 121 IU/L 110  101    AST 0 - 40 IU/L 15  11    ALT 0 - 32 IU/L 22  10     Lipid Panel     Component Value Date/Time   CHOL 250 (H) 04/16/2021 1531   TRIG 156 (H) 04/16/2021 1531   HDL 46 04/16/2021 1531   CHOLHDL 5.4 (H) 04/16/2021 1531   CHOLHDL 4.4 02/18/2019 0332   VLDL 17 02/18/2019 0332   LDLCALC 175 (H) 04/16/2021 1531    CBC    Component Value Date/Time   WBC 11.8 (H) 11/12/2021 0948   WBC 9.3 02/18/2019 1123   RBC 4.93 11/12/2021 0948   RBC 4.54 02/18/2019 1123   HGB 12.7 11/12/2021 0948   HCT 39.1 11/12/2021 0948   PLT 362 11/12/2021 0948   MCV 79 11/12/2021 0948  MCH 25.8 (L) 11/12/2021 0948   MCH 25.6 (L) 02/18/2019 1123   MCHC 32.5 11/12/2021 0948   MCHC 30.6 02/18/2019 1123   RDW  13.9 11/12/2021 0948   LYMPHSABS 2.1 11/12/2021 0948   MONOABS 0.7 02/17/2019 1955   EOSABS 0.2 11/12/2021 0948   BASOSABS 0.1 11/12/2021 0948    ASSESSMENT AND PLAN: 1. Essential hypertension Not at goal. Continue DASH diet. Increase amlodipine to 10 mg daily. - amLODipine (NORVASC) 10 MG tablet; Take 1 tablet (10 mg total) by mouth daily.  Dispense: 90 tablet; Refill: 1  2. Pure hypercholesterolemia - atorvastatin (LIPITOR) 80 MG tablet; Take 1 tablet (80 mg total) by mouth daily at 6 PM.  Dispense: 90 tablet; Refill: 1  3. Tobacco dependence Pt is current smoker. Patient advised to quit smoking. Discussed health risks associated with smoking including lung and other types of cancers, chronic lung diseases and CV risks.. Pt ready to give trail of quitting.   Discussed methods to help quit including quitting cold [place], use of NRT, Chantix and Bupropion.  Pt wanting to try: Nicotine gum.  We also discussed ways to decrease boredom so that she does not feel the need to smoke out of boredom. 3__ Minutes spent on counseling. F/U: Assess progress on follow-up visit  - nicotine polacrilex (NICORETTE) 2 MG gum; Chew 1 piece (2 mg total) by mouth as needed for smoking cessation.  Dispense: 100 tablet; Refill: 1  4. Class 2 severe obesity due to excess calories with serious comorbidity and body mass index (BMI) of 39.0 to 39.9 in adult (HCC) 5. Prediabetes Patient advised to eliminate sugary drinks from the diet, cut back on portion sizes especially of white carbohydrates, eat more white lean meat like chicken [place] and seafood instead of beef or pork and incorporate fresh fruits and vegetables into the diet daily. Encouraged her to move more.  She is agreeable to trying to walk 3 to 4 days a week for 20 minutes in her complex. - Hemoglobin A1c  6. History of CVA in adulthood Continue aspirin and atorvastatin - aspirin 81 MG chewable tablet; Chew 1 tablet (81 mg total) by mouth daily.   Dispense: 30 tablet; Refill: 11  7. Pneumococcal vaccination declined Recommended.  Patient declined  8. Need for Tdap vaccination Given today.  9. Mammogram declined Recommended.  Patient declined  10. Colon cancer screening declined Discussed colon cancer screening methods.  Patient declined all methods.  11. Osteoporosis screening declined Discussed screening for osteoporosis with DEXA scan.  Patient declined.  12. Need for immunization against influenza - Flu Vaccine QUAD 67mo+IM (Fluarix, Fluzone & Alfiuria Quad PF)     Patient was given the opportunity to ask questions.  Patient verbalized understanding of the plan and was able to repeat key elements of the plan.   This documentation was completed using Paediatric nurse.  Any transcriptional errors are unintentional.  Orders Placed This Encounter  Procedures   Flu Vaccine QUAD 53mo+IM (Fluarix, Fluzone & Alfiuria Quad PF)   Hemoglobin A1c     Requested Prescriptions   Signed Prescriptions Disp Refills   nicotine polacrilex (NICORETTE) 2 MG gum 100 tablet 1    Sig: Chew 1 piece (2 mg total) by mouth as needed for smoking cessation.   amLODipine (NORVASC) 10 MG tablet 90 tablet 1    Sig: Take 1 tablet (10 mg total) by mouth daily.   atorvastatin (LIPITOR) 80 MG tablet 90 tablet 1    Sig: Take 1  tablet (80 mg total) by mouth daily at 6 PM.   aspirin 81 MG chewable tablet 30 tablet 11    Sig: Chew 1 tablet (81 mg total) by mouth daily.    Return in about 4 months (around 09/23/2022) for Appt with Lurena Joiner in 4 wks for Medicare wellness visit.  Karle Plumber, MD, FACP

## 2022-05-24 NOTE — Patient Instructions (Addendum)
Increase amlodipine to 10 mg daily for better blood pressure control.  Goal is 130/80 or lower.  Try snacking on foods or nuts during the day.  Try to get in some walks at least 3 to 4 days a week for 20 to 30 minutes.

## 2022-05-24 NOTE — Progress Notes (Signed)
Refill on cream

## 2022-05-25 ENCOUNTER — Other Ambulatory Visit: Payer: Self-pay | Admitting: Internal Medicine

## 2022-05-25 ENCOUNTER — Other Ambulatory Visit: Payer: Self-pay

## 2022-05-25 LAB — HEMOGLOBIN A1C
Est. average glucose Bld gHb Est-mCnc: 128 mg/dL
Hgb A1c MFr Bld: 6.1 % — ABNORMAL HIGH (ref 4.8–5.6)

## 2022-05-25 NOTE — Telephone Encounter (Signed)
Medication Refill - Medication: mupirocin ointment (BACTROBAN) 2 %   Has the patient contacted their pharmacy? Yes.     Preferred Pharmacy (with phone number or street name):  [place] Phone:  [phone]  Fax:  [phone]     Has the patient been seen for an appointment in the last year OR does the patient have an upcoming appointment? Yes.   Patient states that pcp said it was sent over on 9/25, but neither pharmacy shows a record of it.

## 2022-05-25 NOTE — Telephone Encounter (Signed)
Requested medications are due for refill today.  yes  Requested medications are on the active medications list.  yes  Last refill. 01/03/2022 15g 0 rf  Future visit scheduled.   yes  Notes to clinic.  Medication not assigned to a protocol. Please review for refill.    Requested Prescriptions  Pending Prescriptions Disp Refills   mupirocin cream (BACTROBAN) 2 % 15 g 0    Sig: Apply 1 Application topically 2 (two) times daily.     Off-Protocol Failed - 05/25/2022  3:28 PM      Failed - Medication not assigned to a protocol, review manually.      Passed - Valid encounter within last 12 months    Recent Outpatient Visits           Yesterday Essential hypertension   [place], [place]   6 months ago Colon cancer screening   [place], Dionne Bucy, [place]   1 year ago Pure hypercholesterolemia   [place], [place], [place]   3 years ago History of CVA (cerebrovascular accident) without residual deficits   [place], [place]   3 years ago Cerebrovascular accident (CVA) due to thrombosis of cerebral artery [facility])   [place], Dionne [place], [place]       Future Appointments             In 1 month Daisy Blossom, Jarome [place], [place]   In 4 months Wynetta Emery, Dalbert Batman, [place]

## 2022-06-01 ENCOUNTER — Other Ambulatory Visit: Payer: Self-pay

## 2022-06-21 ENCOUNTER — Ambulatory Visit: Payer: Medicare Other | Admitting: Internal Medicine

## 2022-06-25 ENCOUNTER — Ambulatory Visit: Payer: Medicare Other | Admitting: Pharmacist

## 2022-06-25 ENCOUNTER — Telehealth: Payer: Self-pay | Admitting: Pharmacist

## 2022-06-25 NOTE — Telephone Encounter (Signed)
[place] friend,  Patient thought her AWV with me was a phone visit today. She wants to reschedule. Can you schedule her to next available?

## 2022-07-02 ENCOUNTER — Other Ambulatory Visit: Payer: Self-pay

## 2022-07-05 ENCOUNTER — Other Ambulatory Visit: Payer: Self-pay

## 2022-07-06 ENCOUNTER — Telehealth: Payer: Self-pay | Admitting: Internal Medicine

## 2022-07-06 ENCOUNTER — Other Ambulatory Visit: Payer: Self-pay

## 2022-07-06 DIAGNOSIS — Z8673 Personal history of transient ischemic attack (TIA), and cerebral infarction without residual deficits: Secondary | ICD-10-CM

## 2022-07-06 MED ORDER — ASPIRIN 81 MG PO CHEW
81.0000 mg | CHEWABLE_TABLET | Freq: Every day | ORAL | 11 refills | Status: DC
  Filled 2022-07-06 – 2022-12-28 (×5): qty 30, 30d supply, fill #0

## 2022-07-06 NOTE — Telephone Encounter (Signed)
Pt stated she always picks up aspirin 81 MG chewable tablet free; at [facility] she never has to pay for it; however, now she is being advised she can purchase that OTC. Pt stated she is unsure why she now has to purchase them; she has never had to do so in the past.   Pt is upset and requesting a call back.

## 2022-07-06 NOTE — Telephone Encounter (Signed)
Rx sent to pharmacy   

## 2022-07-14 ENCOUNTER — Other Ambulatory Visit: Payer: Self-pay

## 2022-07-14 MED ORDER — KETOROLAC TROMETHAMINE 0.5 % OP SOLN
1.0000 [drp] | Freq: Four times a day (QID) | OPHTHALMIC | 1 refills | Status: DC
  Filled 2022-07-14: qty 5, 25d supply, fill #0
  Filled 2022-07-25 – 2022-07-27 (×2): qty 5, 25d supply, fill #1

## 2022-07-14 MED ORDER — OFLOXACIN 0.3 % OP SOLN
1.0000 [drp] | Freq: Four times a day (QID) | OPHTHALMIC | 1 refills | Status: DC
  Filled 2022-07-14: qty 5, 25d supply, fill #0
  Filled 2022-08-02: qty 5, 25d supply, fill #1

## 2022-07-14 MED ORDER — PREDNISOLONE ACETATE 1 % OP SUSP
1.0000 [drp] | Freq: Four times a day (QID) | OPHTHALMIC | 1 refills | Status: DC
  Filled 2022-07-14: qty 10, 50d supply, fill #0
  Filled 2022-08-02 (×2): qty 10, 50d supply, fill #1
  Filled 2023-01-20: qty 10, 50d supply, fill #0

## 2022-07-26 ENCOUNTER — Other Ambulatory Visit: Payer: Self-pay

## 2022-07-27 ENCOUNTER — Other Ambulatory Visit: Payer: Self-pay

## 2022-08-02 ENCOUNTER — Other Ambulatory Visit: Payer: Self-pay

## 2022-08-03 ENCOUNTER — Ambulatory Visit: Payer: Medicare Other | Attending: Internal Medicine | Admitting: Pharmacist

## 2022-08-03 ENCOUNTER — Encounter: Payer: Self-pay | Admitting: Pharmacist

## 2022-08-03 VITALS — BP 125/78 | HR 51 | Temp 98.6°F | Ht 64.0 in | Wt 228.4 lb

## 2022-08-03 DIAGNOSIS — Z23 Encounter for immunization: Secondary | ICD-10-CM

## 2022-08-03 DIAGNOSIS — Z0000 Encounter for general adult medical examination without abnormal findings: Secondary | ICD-10-CM

## 2022-08-03 DIAGNOSIS — Z1159 Encounter for screening for other viral diseases: Secondary | ICD-10-CM | POA: Diagnosis not present

## 2022-08-03 MED ORDER — ZOSTER VAC RECOMB ADJUVANTED 50 MCG/0.5ML IM SUSR: 0.5000 mL | Freq: Once | INTRAMUSCULAR | 0 refills | Status: AC

## 2022-08-03 NOTE — Progress Notes (Signed)
Subjective:   Sheila Webster is a 66 y.o. female who presents for Medicare Annual (Subsequent) preventive examination.    Objective:    Today's Vitals   08/03/22 1031  BP: 125/78  Pulse: (!) 51  Temp: 98.6 F (37 C)  SpO2: 98%  Weight: 228 lb 6.4 oz (103.6 kg)  Height: 5\' 4"  (1.626 m)  PainSc: 0-No pain   Body mass index is 39.2 kg/m.     08/03/2022   10:33 AM 05/17/2021   11:04 AM 02/18/2019   12:45 AM 02/17/2019    6:37 PM 03/22/2018    7:10 AM 06/07/2017    5:52 PM  Advanced Directives  Does Patient Have a Medical Advance Directive? No No No No No No  Would patient like information on creating a medical advance directive? No - Patient declined Yes (ED - Information included in AVS) No - Patient declined No - Patient declined No - Patient declined     Current Medications (verified) Outpatient Encounter Medications as of 08/03/2022  Medication Sig   amLODipine (NORVASC) 10 MG tablet Take 1 tablet (10 mg total) by mouth daily.   APPLE CIDER VINEGAR PO Take 450 mg by mouth 3 (three) times daily.   aspirin 81 MG chewable tablet Chew 1 tablet (81 mg total) by mouth daily.   atorvastatin (LIPITOR) 80 MG tablet Take 1 tablet (80 mg total) by mouth daily at 6 PM.   ketorolac (ACULAR) 0.5 % ophthalmic solution Place 1 drop into the right eye 4 (four) times daily, starting 1 day before surgery.   mupirocin cream (BACTROBAN) 2 % Apply 1 application. topically 2 (two) times daily.   ofloxacin (OCUFLOX) 0.3 % ophthalmic solution Instill 1 drop into right eye four times a day, starting 1 day before surgery.   prednisoLONE acetate (PRED FORTE) 1 % ophthalmic suspension Instill 1 drop into right eye four times a day AFTER SURGERY   Zoster Vaccine Adjuvanted [facility]) injection Inject 0.5 mLs into the muscle once for 1 dose.   nicotine polacrilex (NICORETTE) 2 MG gum Chew 1 piece (2 mg total) by mouth as needed for smoking cessation. (Patient not taking: Reported on 08/03/2022)   No  facility-administered encounter medications on file as of 08/03/2022.    Allergies (verified) Patient has no known allergies.   History: Past Medical History:  Diagnosis Date   Hypertension    Irregular heartbeat    Past Surgical History:  Procedure Laterality Date   BREAST SURGERY     KNEE SURGERY Right    Family History  Problem Relation Age of Onset   Cancer Mother    Lymphoma Sister    Social History   Socioeconomic History   Marital status: Single    Spouse name: Not on file   Number of children: Not on file   Years of education: Not on file   Highest education level: Not on file  Occupational History   Occupation: Unemployed  Tobacco Use   Smoking status: Every Day    Packs/day: 0.25    Types: Cigarettes   Smokeless tobacco: Never  Vaping Use   Vaping Use: Never used  Substance and Sexual Activity   Alcohol use: No   Drug use: No   Sexual activity: Yes    Birth control/protection: Post-menopausal  Other Topics Concern   Not on file  Social History Narrative   Not on file   Social Determinants of Health   Financial Resource Strain: Not on file  Food Insecurity:  No Food Insecurity (05/17/2021)   Hunger Vital Sign    Worried About Running Out of Food in the Last Year: Never true    Ran Out of Food in the Last Year: Never true  Transportation Needs: No Transportation Needs (05/17/2021)   PRAPARE - Administrator, Civil ServiceTransportation    Lack of Transportation (Medical): No    Lack of Transportation (Non-Medical): No  Physical Activity: Insufficiently Active (05/17/2021)   Exercise Vital Sign    Days of Exercise per Week: 3 days    Minutes of Exercise per Session: 30 min  Stress: Not on file  Social Connections: Not on file    Tobacco Counseling Ready to quit: No Counseling given: Yes   Clinical Intake:  Pre-visit preparation completed: No  Pain : No/denies pain Pain Score: 0-No pain     BMI - recorded: 39.2 Nutritional Status: BMI > 30  Obese Diabetes:  No  How often do you need to have someone help you when you read instructions, pamphlets, or other written materials from your doctor or pharmacy?: 1 - Never What is the last grade level you completed in school?: 12th  Diabetic?   Interpreter Needed?: No  Information entered by :: [facility]   Activities of Daily Living    08/03/2022   10:33 AM  In your present state of health, do you have any difficulty performing the following activities:  Hearing? 0  Vision? 0  Difficulty concentrating or making decisions? 0  Walking or climbing stairs? 0  Dressing or bathing? 0  Doing errands, shopping? 0  Preparing Food and eating ? N  Using the Toilet? N  In the past six months, have you accidently leaked urine? N  Do you have problems with loss of bowel control? N  Managing your Medications? N  Managing your Finances? N  Housekeeping or managing your Housekeeping? N    Patient Care Team: Marcine MatarJohnson, Deborah B, MD as PCP - General (Internal Medicine)  Indicate any recent Medical Services you may have received from other than [facility] providers in the past year (date may be approximate).     Assessment:   This is a routine wellness examination for Sheila Webster.  Hearing/Vision screen No results found.  Dietary issues and exercise activities discussed: Current Exercise Habits: The patient does not participate in regular exercise at present, Exercise limited by: None identified   Goals Addressed   None   Depression Screen    08/03/2022   10:33 AM 05/24/2022    9:14 AM 11/12/2021    9:21 AM 05/17/2021   11:06 AM 04/16/2021    3:00 PM 04/12/2019   11:25 AM 02/28/2019    1:28 PM  PHQ 2/9 Scores  PHQ - 2 Score 0 0 0 0 0 0 0  PHQ- 9 Score  0 0 0 0      Fall Risk    08/03/2022   10:33 AM 05/24/2022    9:09 AM 05/17/2021   11:06 AM 04/16/2021    2:59 PM 02/28/2019    1:28 PM  Fall Risk   Falls in the past year? 0 0 0 0 0  Number falls in past yr: 0 0 0 0   Injury with Fall? 0 0 1 0   Risk for fall  due to :  No Fall Risks No Fall Risks    Follow up Falls evaluation completed;Education provided;Falls prevention discussed  Education provided      FALL RISK PREVENTION PERTAINING TO THE HOME:  Any stairs in or  around the home? No  If so, are there any without handrails? No  Home free of loose throw rugs in walkways, pet beds, electrical cords, etc? Yes  Adequate lighting in your home to reduce risk of falls? Yes   ASSISTIVE DEVICES UTILIZED TO PREVENT FALLS:  Life alert? No  Use of a cane, walker or w/c? No  Grab bars in the bathroom? No  Shower chair or bench in shower? No  Elevated toilet seat or a handicapped toilet? No   TIMED UP AND GO:  Was the test performed? Yes .  Length of time to ambulate 10 feet: 8 sec.   Gait slow and steady without use of assistive device  Cognitive Function:    08/03/2022   10:34 AM  MMSE - Mini Mental State Exam  Orientation to time 5  Orientation to Place 5  Registration 3  Attention/ Calculation 5  Recall 3  Language- name 2 objects 2  Language- repeat 1  Language- follow 3 step command 3  Language- read & follow direction 1  Write a sentence 1  Copy design 1  Total score 30        05/17/2021   11:24 AM 05/17/2021   11:05 AM  6CIT Screen  What Year?  0 points  What month? 0 points 0 points  What time? 0 points 0 points  Count back from 20 0 points 0 points  Months in reverse 0 points 0 points  Repeat phrase 0 points 0 points  Total Score  0 points    Immunizations Immunization History  Administered Date(s) Administered   Influenza,inj,Quad PF,6+ Mos 05/24/2022   Janssen (J&J) SARS-COV-2 Vaccination 11/03/2019   PNEUMOCOCCAL CONJUGATE-20 08/03/2022   Tdap 08/03/2022   Zoster Recombinat (Shingrix) 03/08/2022    TDAP status: Completed at today's visit  Flu Vaccine status: Up to date  Pneumococcal vaccine status: Completed during today's visit.  Covid-19 vaccine status: Information provided on how to obtain  vaccines.   Qualifies for Shingles Vaccine? Yes   Zostavax completed No   Shingrix rx sent to patient's pharmacy.   Screening Tests Health Maintenance  Topic Date Due   Hepatitis C Screening  Never done   COVID-19 Vaccine (2 - 2023-24 season) 04/30/2022   Zoster Vaccines- Shingrix (2 of 2) 05/03/2022   MAMMOGRAM  05/25/2023 (Originally 03/29/2006)   DEXA SCAN  05/25/2023 (Originally 03/29/2021)   COLONOSCOPY (Pts 45-5yrs Insurance coverage will need to be confirmed)  05/25/2023 (Originally 03/29/2001)   Medicare Annual Wellness (AWV)  08/04/2023   DTaP/Tdap/Td (2 - Td or Tdap) 08/03/2032   Pneumonia Vaccine 74+ Years old  Completed   INFLUENZA VACCINE  Completed   HPV VACCINES  Aged Out    Health Maintenance  Health Maintenance Due  Topic Date Due   Hepatitis C Screening  Never done   COVID-19 Vaccine (2 - 2023-24 season) 04/30/2022   Zoster Vaccines- Shingrix (2 of 2) 05/03/2022    Colorectal Cancer screening: declined  Mammogram: patient declined.  Bone density screening: patient declined  Lung Cancer Screening: patient declined  Additional Screening:  Hepatitis C Screening: does qualify; Completed today.  Vision Screening: Recommended annual ophthalmology exams for early detection of glaucoma and other disorders of the eye. Is the patient up to date with their annual eye exam?  Yes  Who is the provider or what is the name of the office in which the patient attends annual eye exams? Dr. Dione Booze  Dental Screening: Recommended annual dental exams for proper  oral hygiene  Community Resource Referral / Chronic Care Management: CRR required this visit?  No   CCM required this visit?  No      Plan:     I have personally reviewed and noted the following in the patient's chart:   Medical and social history Use of alcohol, tobacco or illicit drugs  Current medications and supplements including opioid prescriptions. Patient is not currently taking opioid  prescriptions. Functional ability and status Nutritional status Physical activity Advanced directives List of other physicians Hospitalizations, surgeries, and ER visits in previous 12 months Vitals Screenings to include cognitive, depression, and falls Referrals and appointments  In addition, I have reviewed and discussed with patient certain preventive protocols, quality metrics, and best practice recommendations. A written personalized care plan for preventive services as well as general preventive health recommendations were provided to patient.     Drucilla Chalet, RPH-CPP   08/03/2022

## 2022-08-04 LAB — HEPATITIS C ANTIBODY: Hep C Virus Ab: NONREACTIVE

## 2022-09-01 ENCOUNTER — Other Ambulatory Visit: Payer: Self-pay

## 2022-09-07 ENCOUNTER — Other Ambulatory Visit: Payer: Self-pay

## 2022-09-09 ENCOUNTER — Other Ambulatory Visit: Payer: Self-pay

## 2022-09-23 ENCOUNTER — Other Ambulatory Visit: Payer: Self-pay | Admitting: Pharmacist

## 2022-09-23 ENCOUNTER — Other Ambulatory Visit: Payer: Self-pay

## 2022-09-23 ENCOUNTER — Ambulatory Visit: Payer: 59 | Attending: Internal Medicine | Admitting: Internal Medicine

## 2022-09-23 ENCOUNTER — Encounter: Payer: Self-pay | Admitting: Internal Medicine

## 2022-09-23 VITALS — BP 124/75 | HR 60 | Temp 98.2°F | Ht 64.0 in | Wt 231.0 lb

## 2022-09-23 DIAGNOSIS — L732 Hidradenitis suppurativa: Secondary | ICD-10-CM | POA: Diagnosis not present

## 2022-09-23 DIAGNOSIS — F17200 Nicotine dependence, unspecified, uncomplicated: Secondary | ICD-10-CM | POA: Diagnosis not present

## 2022-09-23 DIAGNOSIS — I10 Essential (primary) hypertension: Secondary | ICD-10-CM

## 2022-09-23 DIAGNOSIS — E7800 Pure hypercholesterolemia, unspecified: Secondary | ICD-10-CM

## 2022-09-23 DIAGNOSIS — E66812 Obesity, class 2: Secondary | ICD-10-CM | POA: Insufficient documentation

## 2022-09-23 DIAGNOSIS — Z6839 Body mass index (BMI) 39.0-39.9, adult: Secondary | ICD-10-CM

## 2022-09-23 MED ORDER — SULFAMETHOXAZOLE-TRIMETHOPRIM 800-160 MG PO TABS
1.0000 | ORAL_TABLET | Freq: Two times a day (BID) | ORAL | 0 refills | Status: DC
  Filled 2022-09-23: qty 14, 7d supply, fill #0

## 2022-09-23 MED ORDER — MUPIROCIN CALCIUM 2 % EX CREA
1.0000 | TOPICAL_CREAM | Freq: Two times a day (BID) | CUTANEOUS | 0 refills | Status: DC
  Filled 2022-09-23 – 2022-12-28 (×3): qty 15, 8d supply, fill #0

## 2022-09-23 MED ORDER — ATORVASTATIN CALCIUM 80 MG PO TABS
80.0000 mg | ORAL_TABLET | Freq: Every day | ORAL | 1 refills | Status: DC
  Filled 2022-09-23: qty 90, 90d supply, fill #0
  Filled 2022-12-28 – 2022-12-29 (×2): qty 90, 90d supply, fill #1

## 2022-09-23 MED ORDER — AMLODIPINE BESYLATE 10 MG PO TABS
10.0000 mg | ORAL_TABLET | Freq: Every day | ORAL | 1 refills | Status: DC
  Filled 2022-09-23 – 2022-12-02 (×3): qty 90, 90d supply, fill #0

## 2022-09-23 MED ORDER — MUPIROCIN 2 % EX OINT
1.0000 | TOPICAL_OINTMENT | Freq: Two times a day (BID) | CUTANEOUS | 0 refills | Status: DC
  Filled 2022-09-23: qty 22, 11d supply, fill #0

## 2022-09-23 MED ORDER — NICOTINE 14 MG/24HR TD PT24
14.0000 mg | MEDICATED_PATCH | Freq: Every day | TRANSDERMAL | 1 refills | Status: DC
  Filled 2022-09-23: qty 28, 28d supply, fill #0

## 2022-09-23 NOTE — Patient Instructions (Signed)
If your insurance does not cover the nicotine patches, you can call 1 [phone] to request the patches for free. Healthy Eating Following a healthy eating pattern may help you to achieve and maintain a healthy body weight, reduce the risk of chronic disease, and live a long and productive life. It is important to follow a healthy eating pattern at an appropriate calorie level for your body. Your nutritional needs should be met primarily through food by choosing a variety of nutrient-rich foods. What are tips for following this plan? Reading food labels Read labels and choose the following: Reduced or low sodium. Juices with 100% fruit juice. Foods with low saturated fats and high polyunsaturated and monounsaturated fats. Foods with whole grains, such as whole wheat, cracked wheat, brown rice, and wild rice. Whole grains that are fortified with folic acid. This is recommended for women who are pregnant or who want to become pregnant. Read labels and avoid the following: Foods with a lot of added sugars. These include foods that contain brown sugar, corn sweetener, corn syrup, dextrose, fructose, glucose, high-fructose corn syrup, honey, invert sugar, lactose, malt syrup, maltose, molasses, raw sugar, sucrose, trehalose, or turbinado sugar. Do not eat more than the following amounts of added sugar per day: 6 teaspoons (25 g) for women. 9 teaspoons (38 g) for men. Foods that contain processed or refined starches and grains. Refined grain products, such as white flour, degermed cornmeal, white bread, and white rice. Shopping Choose nutrient-rich snacks, such as vegetables, whole fruits, and nuts. Avoid high-calorie and high-sugar snacks, such as potato chips, fruit snacks, and candy. Use oil-based dressings and spreads on foods instead of solid fats such as butter, stick margarine, or cream cheese. Limit pre-made sauces, mixes, and "instant" products such as flavored rice, instant noodles, and  ready-made pasta. Try more plant-protein sources, such as tofu, tempeh, black beans, edamame, lentils, nuts, and seeds. Explore eating plans such as the Mediterranean diet or vegetarian diet. Cooking Use oil to saut or stir-fry foods instead of solid fats such as butter, stick margarine, or lard. Try baking, boiling, grilling, or broiling instead of frying. Remove the fatty part of meats before cooking. Steam vegetables in water or broth. Meal planning  At meals, imagine dividing your plate into fourths: One-half of your plate is fruits and vegetables. One-fourth of your plate is whole grains. One-fourth of your plate is protein, especially lean meats, poultry, eggs, tofu, beans, or nuts. Include low-fat dairy as part of your daily diet. Lifestyle Choose healthy options in all settings, including home, work, school, restaurants, or stores. Prepare your food safely: Wash your hands after handling raw meats. Keep food preparation surfaces clean by regularly washing with hot, soapy water. Keep raw meats separate from ready-to-eat foods, such as fruits and vegetables. Cook seafood, meat, poultry, and eggs to the recommended internal temperature. Store foods at safe temperatures. In general: Keep cold foods at 66F (4.4C) or below. Keep hot foods at 166F (60C) or above. Keep your freezer at [facility] (-17.8C) or below. Foods are no longer safe to eat when they have been between the temperatures of 40-166F (4.4-60C) for more than 2 hours. What foods should I eat? Fruits Aim to eat 2 cup-equivalents of fresh, canned (in natural juice), or frozen fruits each day. Examples of 1 cup-equivalent of fruit include 1 small apple, 8 large strawberries, 1 cup canned fruit,  cup dried fruit, or 1 cup 100% juice. Vegetables Aim to eat 2-3 cup-equivalents of fresh and frozen vegetables  each day, including different varieties and colors. Examples of 1 cup-equivalent of vegetables include 2 medium  carrots, 2 cups raw, leafy greens, 1 cup chopped vegetable (raw or cooked), or 1 medium baked potato. Grains Aim to eat 6 ounce-equivalents of whole grains each day. Examples of 1 ounce-equivalent of grains include 1 slice of bread, 1 cup ready-to-eat cereal, 3 cups popcorn, or  cup cooked rice, pasta, or cereal. Meats and other proteins Aim to eat 5-6 ounce-equivalents of protein each day. Examples of 1 ounce-equivalent of protein include 1 egg, 1/2 cup nuts or seeds, or 1 tablespoon (16 g) peanut butter. A cut of meat or fish that is the size of a deck of cards is about 3-4 ounce-equivalents. Of the protein you eat each week, try to have at least 8 ounces come from seafood. This includes salmon, trout, herring, and anchovies. Dairy Aim to eat 3 cup-equivalents of fat-free or low-fat dairy each day. Examples of 1 cup-equivalent of dairy include 1 cup (240 mL) milk, 8 ounces (250 g) yogurt, 1 ounces (44 g) natural cheese, or 1 cup (240 mL) fortified soy milk. Fats and oils Aim for about 5 teaspoons (21 g) per day. Choose monounsaturated fats, such as canola and olive oils, avocados, peanut butter, and most nuts, or polyunsaturated fats, such as sunflower, corn, and soybean oils, walnuts, pine nuts, sesame seeds, sunflower seeds, and flaxseed. Beverages Aim for six 8-oz glasses of water per day. Limit coffee to three to five 8-oz cups per day. Limit caffeinated beverages that have added calories, such as soda and energy drinks. Limit alcohol intake to no more than 1 drink a day for nonpregnant women and 2 drinks a day for men. One drink equals 12 oz of beer (355 mL), 5 oz of wine (148 mL), or 1 oz of hard liquor (44 mL). Seasoning and other foods Avoid adding excess amounts of salt to your foods. Try flavoring foods with herbs and spices instead of salt. Avoid adding sugar to foods. Try using oil-based dressings, sauces, and spreads instead of solid fats. This information is based on general [place]  nutrition guidelines. For more information, visit [employer].es. Exact amounts may vary based on your nutrition needs. Summary A healthy eating plan may help you to maintain a healthy weight, reduce the risk of chronic diseases, and stay active throughout your life. Plan your meals. Make sure you eat the right portions of a variety of nutrient-rich foods. Try baking, boiling, grilling, or broiling instead of frying. Choose healthy options in all settings, including home, work, school, restaurants, or stores. This information is not intended to replace advice given to you by your health care provider. Make sure you discuss any questions you have with your health care provider. Document Revised: 01/27/2022 Document Reviewed: 04/14/2021 Elsevier Patient Education  [place].

## 2022-09-23 NOTE — Progress Notes (Signed)
Patient ID: Sheila Webster, female    DOB: [birth year]  MRN: [id number]  CC: Hypertension (HTN f/u. Med refills./Discuss ointment for boils. Dallie Piles received flu vax. )   Subjective: Sheila Webster is a 67 y.o. female who presents for chronic disease management Her concerns today include:  Patient with history of CVA (LT mid corona radiata infarct), tobacco dependence, HTN, hidradenitis   HTN: Reports compliance with Norvasc 10 mg daily.  She limits salt in the foods.  No chest pains or shortness of breath. HL: Taking and tolerating atorvastatin 80 mg daily. Obesity: On last visit she reported:  "My problem is I snack all day and wake up through the night to eat."  Snacks on carrots.  Not getting in much exercise.  Reports since last visit she was down to 223 lbs but started drinking sodas again.  Walking a little more.   Tob dep:  indicated desire to quit on last visit.  Prescribed nicotine gum.  Did not like potential side effects.  "I smoke when I'm bore." Reports intermittent boils all over the body -axilla BL, abdominal fold, buttock.  Hx of hidradenitis.  Dealt with it since she was a teen.  Currently small one beginning to form on the LT upper inner thigh. Notice they occur more if she drinks a lot of caffine or increase smoking.     HM: Patient reports having had her second Shingrix vaccine at [facility]  Patient Active Problem List   Diagnosis Date Noted   Class 2 severe obesity due to excess calories with serious comorbidity and body mass index (BMI) of 39.0 to 39.9 in adult [facility]) 09/23/2022   Hidradenitis suppurativa 04/13/2019   Obesity (BMI 35.0-39.9 without comorbidity) 04/12/2019   Tobacco dependence 04/12/2019   History of CVA (cerebrovascular accident) without residual deficits 04/12/2019   Pure hypercholesterolemia    CVA (cerebral vascular accident) (HCC) 02/17/2019   Essential hypertension    Tobacco use      Current Outpatient Medications on File  Prior to Visit  Medication Sig Dispense Refill   APPLE CIDER VINEGAR PO Take 450 mg by mouth 3 (three) times daily.     aspirin 81 MG chewable tablet Chew 1 tablet (81 mg total) by mouth daily. 30 tablet 11   prednisoLONE acetate (PRED FORTE) 1 % ophthalmic suspension Instill 1 drop into right eye four times a day AFTER SURGERY (Patient not taking: Reported on 09/23/2022) 10 mL 1   No current facility-administered medications on file prior to visit.    No Known Allergies  Social History   Socioeconomic History   Marital status: Single    Spouse name: Not on file   Number of children: Not on file   Years of education: Not on file   Highest education level: Not on file  Occupational History   Occupation: Unemployed  Tobacco Use   Smoking status: Every Day    Packs/day: 0.25    Types: Cigarettes   Smokeless tobacco: Never  Vaping Use   Vaping Use: Never used  Substance and Sexual Activity   Alcohol use: No   Drug use: No   Sexual activity: Yes    Birth control/protection: Post-menopausal  Other Topics Concern   Not on file  Social History Narrative   Not on file   Social Determinants of Health   Financial Resource Strain: Not on file  Food Insecurity: No Food Insecurity (05/17/2021)   Hunger Vital Sign    Worried About Running  Out of Food in the Last Year: Never true    Ran Out of Food in the Last Year: Never true  Transportation Needs: No Transportation Needs (05/17/2021)   PRAPARE - Hydrologist (Medical): No    Lack of Transportation (Non-Medical): No  Physical Activity: Insufficiently Active (05/17/2021)   Exercise Vital Sign    Days of Exercise per Week: 3 days    Minutes of Exercise per Session: 30 min  Stress: Not on file  Social Connections: Not on file  Intimate Partner Violence: Not on file    Family History  Problem Relation Age of Onset   Cancer Mother    Lymphoma Sister     Past Surgical History:  Procedure Laterality  Date   BREAST SURGERY     KNEE SURGERY Right     ROS: Review of Systems Negative except as stated above  PHYSICAL EXAM: BP 124/75 (BP Location: Left Arm, Patient Position: Sitting, Cuff Size: Normal)   Pulse 60   Temp 98.2 F (36.8 C) (Oral)   Ht 5\' 4"  (1.626 m)   Wt 231 lb (104.8 kg)   SpO2 95%   BMI 39.65 kg/m   Wt Readings from Last 3 Encounters:  09/23/22 231 lb (104.8 kg)  08/03/22 228 lb 6.4 oz (103.6 kg)  05/24/22 230 lb 12.8 oz (104.7 kg)    Physical Exam  General appearance - alert, well appearing, and in no distress Mental status - normal mood, behavior, speech, dress, motor activity, and thought processes Neck - supple, no significant adenopathy Chest - clear to auscultation, no wheezes, rales or rhonchi, symmetric air entry Heart - normal rate, regular rhythm, normal S1, S2, no murmurs, rubs, clicks or gallops Extremities - peripheral pulses normal, no pedal edema, no clubbing or cyanosis Skin -patient with scarring in both axilla.  She declined having me look at the new lesion on the left upper inner thigh.      Latest Ref Rng & Units 11/12/2021    9:48 AM 04/16/2021    3:31 PM 02/18/2019   11:23 AM  CMP  Glucose 70 - 99 mg/dL 91  97  123   BUN 8 - 27 mg/dL 11  14  13    Creatinine 0.57 - 1.00 mg/dL 0.73  0.67  0.66   Sodium 134 - 144 mmol/L 139  138  139   Potassium 3.5 - 5.2 mmol/L 4.3  4.5  3.7   Chloride 96 - 106 mmol/L 103  103  108   CO2 20 - 29 mmol/L 23  24  24    Calcium 8.7 - 10.3 mg/dL 9.0  9.2  8.4   Total Protein 6.0 - 8.5 g/dL 6.7  7.0    Total Bilirubin 0.0 - 1.2 mg/dL 0.2  <0.2    Alkaline Phos 44 - 121 IU/L 110  101    AST 0 - 40 IU/L 15  11    ALT 0 - 32 IU/L 22  10     Lipid Panel     Component Value Date/Time   CHOL 250 (H) 04/16/2021 1531   TRIG 156 (H) 04/16/2021 1531   HDL 46 04/16/2021 1531   CHOLHDL 5.4 (H) 04/16/2021 1531   CHOLHDL 4.4 02/18/2019 0332   VLDL 17 02/18/2019 0332   LDLCALC 175 (H) 04/16/2021 1531     CBC    Component Value Date/Time   WBC 11.8 (H) 11/12/2021 0948   WBC 9.3 02/18/2019 1123   RBC  4.93 11/12/2021 0948   RBC 4.54 02/18/2019 1123   HGB 12.7 11/12/2021 0948   HCT 39.1 11/12/2021 0948   PLT 362 11/12/2021 0948   MCV 79 11/12/2021 0948   MCH 25.8 (L) 11/12/2021 0948   MCH 25.6 (L) 02/18/2019 1123   MCHC 32.5 11/12/2021 0948   MCHC 30.6 02/18/2019 1123   RDW 13.9 11/12/2021 0948   LYMPHSABS 2.1 11/12/2021 0948   MONOABS 0.7 02/17/2019 1955   EOSABS 0.2 11/12/2021 0948   BASOSABS 0.1 11/12/2021 0948    ASSESSMENT AND PLAN: 1. Essential hypertension At goal.  Continue Norvasc - amLODipine (NORVASC) 10 MG tablet; Take 1 tablet (10 mg total) by mouth daily.  Dispense: 90 tablet; Refill: 1 - CBC - Comprehensive metabolic panel  2. Class 2 severe obesity due to excess calories with serious comorbidity and body mass index (BMI) of 39.0 to 39.9 in adult [facility]) Encouraged her to eliminate sugary drinks from the diet.  Encouraged her to try to move more.  3. Tobacco dependence Discussed ongoing health risks associated with smoking.  She would like to try the nicotine patch instead.  We will start with the intermediate dose.  I have also given her information for 1 800 quit now in case it is not covered by her insurance - nicotine (NICODERM CQ - DOSED IN MG/24 HOURS) 14 mg/24hr patch; Place 1 patch (14 mg total) onto the skin daily.  Dispense: 28 patch; Refill: 1  4. Pure hypercholesterolemia - atorvastatin (LIPITOR) 80 MG tablet; Take 1 tablet (80 mg total) by mouth daily at 6 PM.  Dispense: 90 tablet; Refill: 1 - Lipid panel  5. Hidradenitis suppurativa - sulfamethoxazole-trimethoprim (BACTRIM DS) 800-160 MG tablet; Take 1 tablet by mouth 2 (two) times daily.  Dispense: 14 tablet; Refill: 0 - mupirocin cream (BACTROBAN) 2 %; Apply 1 Application topically 2 (two) times daily.  Dispense: 15 g; Refill: 0 - Ambulatory referral to Dermatology     Patient was given  the opportunity to ask questions.  Patient verbalized understanding of the plan and was able to repeat key elements of the plan.   This documentation was completed using Radio producer.  Any transcriptional errors are unintentional.  Orders Placed This Encounter  Procedures   CBC   Comprehensive metabolic panel   Lipid panel   Ambulatory referral to Dermatology     Requested Prescriptions   Signed Prescriptions Disp Refills   sulfamethoxazole-trimethoprim (BACTRIM DS) 800-160 MG tablet 14 tablet 0    Sig: Take 1 tablet by mouth 2 (two) times daily.   mupirocin cream (BACTROBAN) 2 % 15 g 0    Sig: Apply 1 Application topically 2 (two) times daily.   nicotine (NICODERM CQ - DOSED IN MG/24 HOURS) 14 mg/24hr patch 28 patch 1    Sig: Place 1 patch (14 mg total) onto the skin daily.   amLODipine (NORVASC) 10 MG tablet 90 tablet 1    Sig: Take 1 tablet (10 mg total) by mouth daily.   atorvastatin (LIPITOR) 80 MG tablet 90 tablet 1    Sig: Take 1 tablet (80 mg total) by mouth daily at 6 PM.    Return in about 4 months (around 01/22/2023).  Karle Plumber, MD, FACP

## 2022-09-24 ENCOUNTER — Other Ambulatory Visit: Payer: Self-pay

## 2022-09-24 LAB — COMPREHENSIVE METABOLIC PANEL
ALT: 14 IU/L (ref 0–32)
AST: 13 IU/L (ref 0–40)
Albumin/Globulin Ratio: 1.5 (ref 1.2–2.2)
Albumin: 4.3 g/dL (ref 3.9–4.9)
Alkaline Phosphatase: 122 IU/L — ABNORMAL HIGH (ref 44–121)
BUN/Creatinine Ratio: 16 (ref 12–28)
BUN: 10 mg/dL (ref 8–27)
Bilirubin Total: 0.3 mg/dL (ref 0.0–1.2)
CO2: 20 mmol/L (ref 20–29)
Calcium: 9.2 mg/dL (ref 8.7–10.3)
Chloride: 104 mmol/L (ref 96–106)
Creatinine, Ser: 0.62 mg/dL (ref 0.57–1.00)
Globulin, Total: 2.8 g/dL (ref 1.5–4.5)
Glucose: 84 mg/dL (ref 70–99)
Potassium: 4.5 mmol/L (ref 3.5–5.2)
Sodium: 141 mmol/L (ref 134–144)
Total Protein: 7.1 g/dL (ref 6.0–8.5)
eGFR: 98 mL/min/{1.73_m2} (ref >59)

## 2022-09-24 LAB — CBC
Hematocrit: 40.7 % (ref 34.0–46.6)
Hemoglobin: 12.8 g/dL (ref 11.1–15.9)
MCH: 25.5 pg — ABNORMAL LOW (ref 26.6–33.0)
MCHC: 31.4 g/dL — ABNORMAL LOW (ref 31.5–35.7)
MCV: 81 fL (ref 79–97)
Platelets: 392 10*3/uL (ref 150–450)
RBC: 5.02 x10E6/uL (ref 3.77–5.28)
RDW: 14 % (ref 11.7–15.4)
WBC: 12.7 10*3/uL — ABNORMAL HIGH (ref 3.4–10.8)

## 2022-09-24 LAB — LIPID PANEL
Chol/HDL Ratio: 2.9 ratio (ref 0.0–4.4)
Cholesterol, Total: 136 mg/dL (ref 100–199)
HDL: 47 mg/dL (ref >39)
LDL Chol Calc (NIH): 72 mg/dL (ref 0–99)
Triglycerides: 92 mg/dL (ref 0–149)
VLDL Cholesterol Cal: 17 mg/dL (ref 5–40)

## 2022-09-27 ENCOUNTER — Telehealth: Payer: Self-pay | Admitting: Internal Medicine

## 2022-09-27 NOTE — Telephone Encounter (Signed)
-----  Message from Ena Dawley sent at 09/27/2022 11:19 AM EST ----- Regarding: Dermatology Referral Good Morning  Referral was  sent to [facility]  for  Dermatology  And they contact me by email  to let me know   Sheila Webster, [place] 2055-12-23 has declined to schedule. States she is not interested at this time.

## 2022-10-04 ENCOUNTER — Other Ambulatory Visit: Payer: Self-pay | Admitting: Internal Medicine

## 2022-10-04 DIAGNOSIS — L732 Hidradenitis suppurativa: Secondary | ICD-10-CM

## 2022-10-05 NOTE — Telephone Encounter (Signed)
Requested medication (s) are due for refill today: yes  Requested medication (s) are on the active medication list: yes  Last refill:  09/23/22  Future visit scheduled: yes  Notes to clinic:  Unable to refill per protocol, Rx request was given for short supply, should patient continue to take. Routing for review.      Requested Prescriptions  Pending Prescriptions Disp Refills   sulfamethoxazole-trimethoprim (BACTRIM DS) 800-160 MG tablet 14 tablet 0    Sig: Take 1 tablet by mouth 2 (two) times daily.     Off-Protocol Failed - 10/04/2022 12:53 PM      Failed - Medication not assigned to a protocol, review manually.      Passed - Valid encounter within last 12 months    Recent Outpatient Visits           1 week ago Essential hypertension   [place], MD   2 months ago Need for hepatitis C screening test   [place], Jarome Matin, RPH-CPP   4 months ago Essential hypertension   [place], MD   10 months ago Colon cancer screening   [place], Dionne Bucy, [place]   1 year ago Pure hypercholesterolemia   [place], [place]       Future Appointments             In 3 months [place], Dionne Bucy, PA-C [place]

## 2022-10-07 MED ORDER — SULFAMETHOXAZOLE-TRIMETHOPRIM 800-160 MG PO TABS
1.0000 | ORAL_TABLET | Freq: Two times a day (BID) | ORAL | 0 refills | Status: DC
  Filled 2022-10-07 – 2022-10-11 (×3): qty 14, 7d supply, fill #0

## 2022-10-08 ENCOUNTER — Other Ambulatory Visit: Payer: Self-pay

## 2022-10-11 ENCOUNTER — Other Ambulatory Visit: Payer: Self-pay

## 2022-10-11 ENCOUNTER — Other Ambulatory Visit (HOSPITAL_COMMUNITY): Payer: Self-pay

## 2022-10-14 ENCOUNTER — Other Ambulatory Visit: Payer: Self-pay

## 2022-11-15 ENCOUNTER — Other Ambulatory Visit: Payer: Self-pay

## 2022-11-17 ENCOUNTER — Other Ambulatory Visit: Payer: Self-pay

## 2022-11-22 ENCOUNTER — Other Ambulatory Visit: Payer: Self-pay | Admitting: Internal Medicine

## 2022-11-22 DIAGNOSIS — L732 Hidradenitis suppurativa: Secondary | ICD-10-CM

## 2022-11-23 NOTE — Telephone Encounter (Signed)
Requested medication (s) are due for refill today - yes  Requested medication (s) are on the active medication list -yes  Future visit scheduled -yes  Last refill: 10/07/22 #14  Notes to clinic: off protocol- provider review   Requested Prescriptions  Pending Prescriptions Disp Refills   sulfamethoxazole-trimethoprim (BACTRIM DS) 800-160 MG tablet 14 tablet 0    Sig: Take 1 tablet by mouth 2 (two) times daily.     Off-Protocol Failed - 11/22/2022  7:33 PM      Failed - Medication not assigned to a protocol, review manually.      Passed - Valid encounter within last 12 months    Recent Outpatient Visits           2 months ago Essential hypertension   [place], MD   3 months ago Need for hepatitis C screening test   [place], Jarome Matin, RPH-CPP   6 months ago Essential hypertension   [place], MD   1 year ago Colon cancer screening   [place], Dionne Bucy, [place]   1 year ago Pure hypercholesterolemia   [place], Dionne Bucy, PA-C       Future Appointments             In 1 month [place], [place]               Requested Prescriptions  Pending Prescriptions Disp Refills   sulfamethoxazole-trimethoprim (BACTRIM DS) 800-160 MG tablet 14 tablet 0    Sig: Take 1 tablet by mouth 2 (two) times daily.     Off-Protocol Failed - 11/22/2022  7:33 PM      Failed - Medication not assigned to a protocol, review manually.      Passed - Valid encounter within last 12 months    Recent Outpatient Visits           2 months ago Essential hypertension   [place], MD   3 months ago Need for hepatitis C screening test   [place], RPH-CPP   6 months ago Essential hypertension   [place], MD   1 year ago Colon cancer screening   [place], Dionne Bucy, [place]   1 year ago Pure hypercholesterolemia   [place], [place]       Future Appointments             In 1 month [place], Dionne Bucy, PA-C [place]

## 2022-11-24 ENCOUNTER — Other Ambulatory Visit (HOSPITAL_COMMUNITY): Payer: Self-pay

## 2022-11-29 ENCOUNTER — Other Ambulatory Visit (HOSPITAL_COMMUNITY): Payer: Self-pay

## 2022-12-02 ENCOUNTER — Other Ambulatory Visit: Payer: Self-pay

## 2022-12-03 ENCOUNTER — Other Ambulatory Visit (HOSPITAL_COMMUNITY): Payer: Self-pay

## 2022-12-03 ENCOUNTER — Other Ambulatory Visit: Payer: Self-pay

## 2022-12-28 ENCOUNTER — Other Ambulatory Visit: Payer: Self-pay

## 2022-12-28 ENCOUNTER — Other Ambulatory Visit (HOSPITAL_COMMUNITY): Payer: Self-pay

## 2022-12-29 ENCOUNTER — Other Ambulatory Visit: Payer: Self-pay

## 2023-01-20 ENCOUNTER — Ambulatory Visit: Payer: 59 | Attending: Physician Assistant | Admitting: Physician Assistant

## 2023-01-20 ENCOUNTER — Other Ambulatory Visit: Payer: Self-pay

## 2023-01-20 ENCOUNTER — Encounter: Payer: Self-pay | Admitting: Physician Assistant

## 2023-01-20 ENCOUNTER — Other Ambulatory Visit (HOSPITAL_COMMUNITY): Payer: Self-pay

## 2023-01-20 VITALS — BP 113/73 | HR 63 | Temp 98.6°F | Resp 16 | Ht 64.0 in | Wt 230.0 lb

## 2023-01-20 DIAGNOSIS — R7303 Prediabetes: Secondary | ICD-10-CM

## 2023-01-20 DIAGNOSIS — L732 Hidradenitis suppurativa: Secondary | ICD-10-CM

## 2023-01-20 DIAGNOSIS — Z8673 Personal history of transient ischemic attack (TIA), and cerebral infarction without residual deficits: Secondary | ICD-10-CM

## 2023-01-20 DIAGNOSIS — I10 Essential (primary) hypertension: Secondary | ICD-10-CM | POA: Diagnosis not present

## 2023-01-20 DIAGNOSIS — E7800 Pure hypercholesterolemia, unspecified: Secondary | ICD-10-CM

## 2023-01-20 MED ORDER — FLUTICASONE PROPIONATE 50 MCG/ACT NA SUSP
2.0000 | Freq: Every day | NASAL | 6 refills | Status: DC
  Filled 2023-01-20: qty 16, 28d supply, fill #0
  Filled 2023-01-20: qty 16, fill #0

## 2023-01-20 MED ORDER — ATORVASTATIN CALCIUM 80 MG PO TABS
80.0000 mg | ORAL_TABLET | Freq: Every day | ORAL | 1 refills | Status: DC
  Filled 2023-01-20 – 2023-03-22 (×2): qty 90, 90d supply, fill #0
  Filled 2023-05-16 – 2023-06-22 (×2): qty 90, 90d supply, fill #1

## 2023-01-20 MED ORDER — MUPIROCIN 2 % EX OINT
1.0000 | TOPICAL_OINTMENT | Freq: Two times a day (BID) | CUTANEOUS | 1 refills | Status: DC
  Filled 2023-01-20 (×2): qty 22, 11d supply, fill #0
  Filled 2023-05-16: qty 22, 11d supply, fill #1

## 2023-01-20 MED ORDER — SULFAMETHOXAZOLE-TRIMETHOPRIM 800-160 MG PO TABS
1.0000 | ORAL_TABLET | Freq: Two times a day (BID) | ORAL | 0 refills | Status: DC
  Filled 2023-01-20 (×2): qty 14, 7d supply, fill #0

## 2023-01-20 MED ORDER — ASPIRIN 81 MG PO CHEW
81.0000 mg | CHEWABLE_TABLET | Freq: Every day | ORAL | 11 refills | Status: AC
  Filled 2023-01-20 – 2023-05-16 (×3): qty 30, 30d supply, fill #0

## 2023-01-20 MED ORDER — AMLODIPINE BESYLATE 10 MG PO TABS
10.0000 mg | ORAL_TABLET | Freq: Every day | ORAL | 1 refills | Status: DC
  Filled 2023-01-20 – 2023-03-03 (×2): qty 90, 90d supply, fill #0
  Filled 2023-03-05 – 2023-05-16 (×2): qty 90, 90d supply, fill #1

## 2023-01-20 NOTE — Progress Notes (Signed)
Pt is here for 4 month f/u  Requesting refill on bactrim for her reoccurring boils

## 2023-01-20 NOTE — Patient Instructions (Signed)
Drink 64-100 ounces water daily  Hidradenitis Suppurativa Hidradenitis suppurativa is a long-term (chronic) skin disease. It is similar to a severe form of acne, but it affects areas of the body where acne would be unusual, especially areas of the body where skin rubs against skin and becomes moist. These include: Underarms. Groin. Genital area. Buttocks. Upper thighs. Breasts. Hidradenitis suppurativa may start out as small lumps or pimples caused by blocked skin pores, sweat glands, or hair follicles. Pimples may develop into deep sores that break open (rupture) and drain pus. Over time, affected areas of skin may thicken and become scarred. This condition is rare and does not spread from person to person (non-contagious). What are the causes? The exact cause of this condition is not known. It may be related to: Female and female hormones. An overactive disease-fighting system (immune system). The immune system may over-react to blocked hair follicles or sweat glands and cause swelling and pus-filled sores. What increases the risk? You are more likely to develop this condition if you: Are female. Are 40-63 years old. Have a family history of hidradenitis suppurativa. Have a personal history of acne. Are overweight. Smoke. Take the medicine lithium. What are the signs or symptoms? The first symptoms are usually painful bumps in the skin, similar to pimples. The condition may get worse over time (progress), or it may only cause mild symptoms. If the disease progresses, symptoms may include: Skin bumps getting bigger and growing deeper into the skin. Bumps rupturing and draining pus. Itchy, infected skin. Skin getting thicker and scarred. Tunnels under the skin (fistulas) where pus drains from a bump. Pain during daily activities, such as pain during walking if your groin area is affected. Emotional problems, such as stress or depression. This condition may affect your appearance and  your ability or willingness to wear certain clothes or do certain activities. How is this diagnosed? This condition is diagnosed by a health care provider who specializes in skin conditions (dermatologist). You may be diagnosed based on: Your symptoms and medical history. A physical exam. Testing a pus sample for infection. Blood tests. How is this treated? Your treatment will depend on how severe your symptoms are. The same treatment will not work for everybody with this condition. You may need to try several treatments to find what works best for you. Treatment may include: Cleaning and bandaging (dressing) your wounds as needed. Lifestyle changes, such as new skin care routines. Taking medicines, such as: Antibiotics. Acne medicines. Medicines to reduce the activity of the immune system. A diabetes medicine (metformin). Birth control pills, for women. Steroids to reduce swelling and pain. Working with a mental health care provider, if you experience emotional distress due to this condition. If you have severe symptoms that do not get better with medicine, you may need surgery. Surgery may involve: Using a laser to clear the skin and remove hair follicles. Opening and draining deep sores. Removing the areas of skin that are diseased and scarred. Follow these instructions at home: Medicines  Take over-the-counter and prescription medicines only as told by your health care provider. If you were prescribed antibiotics, take them as told by your health care provider. Do not stop using the antibiotic even if your condition improves. Skin care If you have open wounds, cover them with a clean dressing as told by your health care provider. Keep wounds clean by washing them gently with soap and water when you bathe. Do not shave the areas where you get hidradenitis suppurativa.  Wear loose-fitting clothes. Try to avoid getting overheated or sweaty. If you get sweaty or wet, change into clean,  dry clothes as soon as you can. To help relieve pain and itchiness, cover sore areas with a warm, clean washcloth (warm compress) for 5-10 minutes as often as needed. Your healthcare provider may recommend an antiperspirant deodorant that may be gentle on your skin. A daily antiseptic wash to cleanse affected areas may be suggested by your healthcare provider. General instructions Learn as much as you can about your disease so that you have an active role in your treatment. Work closely with your health care provider to find treatments that work for you. If you are overweight, work with your health care provider to lose weight as recommended. Do not use any products that contain nicotine or tobacco. These products include cigarettes, chewing tobacco, and vaping devices, such as e-cigarettes. If you need help quitting, ask your health care provider. If you struggle with living with this condition, talk with your health care provider or work with a mental health care provider as recommended. Keep all follow-up visits. Where to find more information Hidradenitis Suppurativa Foundation, Inc.: www.hs-foundation.org American Academy of Dermatology: InfoExam.si Contact a health care provider if: You have a flare-up of hidradenitis suppurativa. You have a fever or chills. You have trouble controlling your symptoms at home. You have trouble doing your daily activities because of your symptoms. You have trouble dealing with emotional problems related to your condition. Summary Hidradenitis suppurativa is a long-term (chronic) skin disease. It is similar to a severe form of acne, but it affects areas of the body where acne would be unusual. The first symptoms are usually painful bumps in the skin, similar to pimples. The condition may only cause mild symptoms, or it may get worse over time (progress). If you have open wounds, cover them with a clean dressing as told by your health care provider. Keep  wounds clean by washing them gently with soap and water when you bathe. Besides skin care, treatment may include medicines, laser treatment, and surgery. This information is not intended to replace advice given to you by your health care provider. Make sure you discuss any questions you have with your health care provider. Document Revised: 10/07/2021 Document Reviewed: 10/07/2021 Elsevier Patient Education  2023 [employer].

## 2023-01-20 NOTE — Progress Notes (Signed)
Sheila Webster, is a 67 y.o. female  [id number]  [id number]  DOB - April 04, 1956  No chief complaint on file.      Subjective:   Sheila Webster is a 67 y.o. female here today for med RF.  She has been cutting back on sugars.  She is compliant with meds.  Wants RF of mupiricin and septra in case needed.  She has been cutting back on smoking as well.  Some sinus congestion with allergy season  No problems updated.  ALLERGIES: No Known Allergies  PAST MEDICAL HISTORY: Past Medical History:  Diagnosis Date   Hypertension    Irregular heartbeat     MEDICATIONS AT HOME: Prior to Admission medications   Medication Sig Webster Date End Date Taking? Authorizing Provider  amLODipine (NORVASC) 10 MG tablet Take 1 tablet (10 mg total) by mouth daily. 01/20/23   Anders Simmonds, PA-C  APPLE CIDER VINEGAR PO Take 450 mg by mouth 3 (three) times daily.    [provider]  aspirin 81 MG chewable tablet Chew 1 tablet (81 mg total) by mouth daily. 01/20/23   Anders Simmonds, PA-C  atorvastatin (LIPITOR) 80 MG tablet Take 1 tablet (80 mg total) by mouth daily at 6 PM. 01/20/23   Kellis Mcadam, Marzella Schlein, PA-C  mupirocin ointment (BACTROBAN) 2 % Apply 1 Application topically 2 (two) times daily. 01/20/23   Anders Simmonds, PA-C  nicotine (NICODERM CQ - DOSED IN MG/24 HOURS) 14 mg/24hr patch Place 1 patch (14 mg total) onto the skin daily. Patient not taking: Reported on 01/20/2023 09/23/22   Marcine Matar, MD  prednisoLONE acetate (PRED FORTE) 1 % ophthalmic suspension Instill 1 drop into right eye four times a day AFTER SURGERY Patient not taking: Reported on 09/23/2022 07/14/22     sulfamethoxazole-trimethoprim (BACTRIM DS) 800-160 MG tablet Take 1 tablet by mouth 2 (two) times daily. 01/20/23   Anders Simmonds, PA-C    ROS: Neg resp Neg cardiac Neg GI Neg GU Neg MS Neg psych Neg neuro  Objective:   Vitals:   01/20/23 0846  BP: 113/73  Pulse: 63  Resp: 16  Temp: 98.6 F  (37 C)  SpO2: 97%  Weight: 230 lb (104.3 kg)  Height: 5\' 4"  (1.626 m)   Exam General appearance : Awake, alert, not in any distress. Speech Clear. Not toxic looking HEENT: Atraumatic and Normocephalic;  congested Neck: Supple, no JVD. No cervical lymphadenopathy.  Chest: Good air entry bilaterally, CTAB.  No rales/rhonchi/wheezing CVS: S1 S2 regular, no murmurs.  Extremities: B/L Lower Ext shows no edema, both legs are warm to touch Neurology: Awake alert, and oriented X 3, CN II-XII intact, Non focal Skin: No Rash; no active boils.  Significant scarring B axilla  Data Review Lab Results  Component Value Date   HGBA1C 6.1 (H) 05/24/2022   HGBA1C 5.8 (H) 04/16/2021   HGBA1C 5.9 (H) 02/17/2019    Assessment & Plan   1. Prediabetes She has been cutting back on sugars.   - Hemoglobin A1c Reviewed January labs and blood sugar normal then.  Last A1C=6.1  2. Hidradenitis suppurativa As needed(non active currently.  Increase water intake - sulfamethoxazole-trimethoprim (BACTRIM DS) 800-160 MG tablet; Take 1 tablet by mouth 2 (two) times daily.  Dispense: 14 tablet; Refill: 0 - mupirocin ointment (BACTROBAN) 2 %; Apply 1 Application topically 2 (two) times daily.  Dispense: 22 g; Refill: 1  3. Pure hypercholesterolemia - atorvastatin (LIPITOR) 80 MG tablet; Take 1 tablet (80  mg total) by mouth daily at 6 PM.  Dispense: 90 tablet; Refill: 1  4. Essential hypertension Controlled continue current regimen.  Continue working on decreasing smoking - amLODipine (NORVASC) 10 MG tablet; Take 1 tablet (10 mg total) by mouth daily.  Dispense: 90 tablet; Refill: 1  5. History of CVA in adulthood - aspirin 81 MG chewable tablet; Chew 1 tablet (81 mg total) by mouth daily.  Dispense: 30 tablet; Refill: 11    Return in about 6 months (around 07/23/2023) for PCP chronic conditions.  The patient was given clear instructions to go to ER or return to medical center if symptoms don't improve,  worsen or new problems develop. The patient verbalized understanding. The patient was told to call to get lab results if they haven't heard anything in the next week.      Georgian Co, PA-C [facility] [place], [place] [phone]   01/20/2023, 9:02 AMPatient ID: Sheila Webster, female   DOB: 02-15-1956, 67 y.o.   MRN: [id number]

## 2023-01-21 LAB — HEMOGLOBIN A1C
Est. average glucose Bld gHb Est-mCnc: 126 mg/dL
Hgb A1c MFr Bld: 6 % — ABNORMAL HIGH (ref 4.8–5.6)

## 2023-02-21 ENCOUNTER — Other Ambulatory Visit: Payer: Self-pay

## 2023-02-21 ENCOUNTER — Other Ambulatory Visit (HOSPITAL_COMMUNITY): Payer: Self-pay

## 2023-02-21 ENCOUNTER — Other Ambulatory Visit: Payer: Self-pay | Admitting: Physician Assistant

## 2023-02-21 DIAGNOSIS — L732 Hidradenitis suppurativa: Secondary | ICD-10-CM

## 2023-02-22 NOTE — Telephone Encounter (Signed)
Requested medication (s) are due for refill today: yes  Requested medication (s) are on the active medication list: yes  Last refill:  01/20/23  Future visit scheduled: yes  Notes to clinic:  Medication not assigned to a protocol, review manually.      Requested Prescriptions  Pending Prescriptions Disp Refills   sulfamethoxazole-trimethoprim (BACTRIM DS) 800-160 MG tablet 14 tablet 0    Sig: Take 1 tablet by mouth 2 (two) times daily.     Off-Protocol Failed - 02/21/2023 11:19 AM      Failed - Medication not assigned to a protocol, review manually.      Passed - Valid encounter within last 12 months    Recent Outpatient Visits           1 month ago Prediabetes   [facility] [place], [place], [place]   5 months ago Essential hypertension   [place] [facility] Marcine Matar, MD   6 months ago Need for hepatitis C screening test   [facility] [place], Cornelius Moras, RPH-CPP   9 months ago Essential hypertension   [place] [facility] Marcine Matar, MD   1 year ago Colon cancer screening   [facility] [place], Marzella Schlein, [place]       Future Appointments             In 5 months Laural Benes, Binnie Rail, MD [facility]

## 2023-02-23 ENCOUNTER — Other Ambulatory Visit (HOSPITAL_BASED_OUTPATIENT_CLINIC_OR_DEPARTMENT_OTHER): Payer: Self-pay

## 2023-02-23 ENCOUNTER — Other Ambulatory Visit: Payer: Self-pay

## 2023-02-23 MED ORDER — SULFAMETHOXAZOLE-TRIMETHOPRIM 800-160 MG PO TABS
1.0000 | ORAL_TABLET | Freq: Two times a day (BID) | ORAL | 0 refills | Status: DC
  Filled 2023-02-23: qty 14, 7d supply, fill #0

## 2023-03-04 ENCOUNTER — Other Ambulatory Visit: Payer: Self-pay

## 2023-03-07 ENCOUNTER — Other Ambulatory Visit: Payer: Self-pay

## 2023-03-22 ENCOUNTER — Other Ambulatory Visit (HOSPITAL_COMMUNITY): Payer: Self-pay

## 2023-03-23 ENCOUNTER — Other Ambulatory Visit: Payer: Self-pay

## 2023-05-16 ENCOUNTER — Other Ambulatory Visit: Payer: Self-pay

## 2023-05-16 ENCOUNTER — Other Ambulatory Visit: Payer: Self-pay | Admitting: Internal Medicine

## 2023-05-16 ENCOUNTER — Other Ambulatory Visit (HOSPITAL_COMMUNITY): Payer: Self-pay

## 2023-05-16 DIAGNOSIS — L732 Hidradenitis suppurativa: Secondary | ICD-10-CM

## 2023-05-17 ENCOUNTER — Other Ambulatory Visit: Payer: Self-pay

## 2023-05-17 NOTE — Telephone Encounter (Signed)
Requested medication (s) are due for refill today: For review  Requested medication (s) are on the active medication list: yes    Last refill: 02/23/23  #14 0 refills  Future visit scheduled yes 07/25/23  Notes to clinic:Not delegated, please review. Thank you.  Requested Prescriptions  Pending Prescriptions Disp Refills   sulfamethoxazole-trimethoprim (BACTRIM DS) 800-160 MG tablet 14 tablet 0    Sig: Take 1 tablet by mouth 2 (two) times daily.     Off-Protocol Failed - 05/16/2023  3:17 PM      Failed - Medication not assigned to a protocol, review manually.      Passed - Valid encounter within last 12 months    Recent Outpatient Visits           3 months ago Prediabetes   [facility] [place], [place], [place]   7 months ago Essential hypertension   [place] [facility] Marcine Matar, MD   9 months ago Need for hepatitis C screening test   [facility] [place], Cornelius Moras, RPH-CPP   11 months ago Essential hypertension   [place] [facility] [place] Marcine Matar, MD   1 year ago Colon cancer screening   [place] [place], Marzella Schlein, [place]       Future Appointments             In 2 months Laural Benes, Binnie Rail, MD [facility]

## 2023-05-19 ENCOUNTER — Encounter: Payer: Self-pay | Admitting: Pharmacist

## 2023-05-19 ENCOUNTER — Other Ambulatory Visit: Payer: Self-pay

## 2023-05-19 ENCOUNTER — Other Ambulatory Visit (HOSPITAL_COMMUNITY): Payer: Self-pay

## 2023-06-14 ENCOUNTER — Other Ambulatory Visit: Payer: Self-pay | Admitting: Internal Medicine

## 2023-06-14 ENCOUNTER — Ambulatory Visit: Payer: 59 | Attending: Internal Medicine

## 2023-06-14 VITALS — Ht 64.0 in | Wt 230.0 lb

## 2023-06-14 DIAGNOSIS — Z0000 Encounter for general adult medical examination without abnormal findings: Secondary | ICD-10-CM | POA: Diagnosis not present

## 2023-06-14 DIAGNOSIS — L732 Hidradenitis suppurativa: Secondary | ICD-10-CM

## 2023-06-14 NOTE — Telephone Encounter (Signed)
Requested medication (s) are due for refill today - no  Requested medication (s) are on the active medication list -yes  Future visit scheduled -yes  Last refill: 02/23/23 #14  Notes to clinic: off protocol- provider review   Requested Prescriptions  Pending Prescriptions Disp Refills   sulfamethoxazole-trimethoprim (BACTRIM DS) 800-160 MG tablet 14 tablet 0    Sig: Take 1 tablet by mouth 2 (two) times daily.     Off-Protocol Failed - 06/14/2023 10:08 AM      Failed - Medication not assigned to a protocol, review manually.      Passed - Valid encounter within last 12 months    Recent Outpatient Visits           4 months ago Prediabetes   [facility], [place], [place]   8 months ago Essential hypertension   [place] [facility] Marcine Matar, MD   10 months ago Need for hepatitis C screening test   [facility] Drucilla Chalet, RPH-CPP   1 year ago Essential hypertension   [place] [facility] Marcine Matar, MD   1 year ago Colon cancer screening   [facility] [place], Marzella Schlein, [place]       Future Appointments             In 1 month Marcine Matar, MD [facility]               Requested Prescriptions  Pending Prescriptions Disp Refills   sulfamethoxazole-trimethoprim (BACTRIM DS) 800-160 MG tablet 14 tablet 0    Sig: Take 1 tablet by mouth 2 (two) times daily.     Off-Protocol Failed - 06/14/2023 10:08 AM      Failed - Medication not assigned to a protocol, review manually.      Passed - Valid encounter within last 12 months    Recent Outpatient Visits           4 months ago Prediabetes   [place] [facility] [place], [place], [place]   8 months ago Essential hypertension   [facility] Marcine Matar, MD   10 months ago Need for hepatitis C screening test   [facility] Drucilla Chalet, RPH-CPP   1 year ago Essential hypertension   [place] [facility] Marcine Matar, MD   1 year ago Colon cancer screening   [place] [place], Marzella Schlein, [place]       Future Appointments             In 1 month Laural Benes, Binnie Rail, MD [facility]

## 2023-06-14 NOTE — Patient Instructions (Signed)
Sheila Webster , Thank you for taking time to come for your Medicare Wellness Visit. I appreciate your ongoing commitment to your health goals. Please review the following plan we discussed and let me know if I can assist you in the future.   Referrals/Orders/Follow-Ups/Clinician Recommendations: Aim for 30 minutes of exercise or brisk walking, 6-8 glasses of water, and 5 servings of fruits and vegetables each day.  This is a list of the screening recommended for you and due dates:  Health Maintenance  Topic Date Due   Colon Cancer Screening  Never done   Mammogram  Never done   DEXA scan (bone density measurement)  Never done   Flu Shot  03/31/2023   COVID-19 Vaccine (2 - 2023-24 season) 05/01/2023   Medicare Annual Wellness Visit  06/13/2024   DTaP/Tdap/Td vaccine (2 - Td or Tdap) 08/03/2032   Pneumonia Vaccine  Completed   Hepatitis C Screening  Completed   Zoster (Shingles) Vaccine  Completed   HPV Vaccine  Aged Out    Advanced directives: (ACP Link)Information on Advanced Care Planning can be found at [facility] Advance Health Care Directives Advance Health Care Directives ([email or web address]   Next Medicare Annual Wellness Visit scheduled for next year: Yes

## 2023-06-14 NOTE — Progress Notes (Signed)
Subjective:   Sheila Webster is a 67 y.o. female who presents for Medicare Annual (Subsequent) preventive examination.  Visit Complete: Virtual I connected with  [name] on 06/14/23 by a audio enabled telemedicine application and verified that I am speaking with the correct person using two identifiers.  Patient Location: Home  Provider Location: Home Office  I discussed the limitations of evaluation and management by telemedicine. The patient expressed understanding and agreed to proceed.  Vital Signs: Because this visit was a virtual/telehealth visit, some criteria may be missing or patient reported. Any vitals not documented were not able to be obtained and vitals that have been documented are patient reported.  Cardiac Risk Factors include: advanced age (>63men, >27 women);hypertension;dyslipidemia     Objective:    Today's Vitals   06/14/23 0849  Weight: 230 lb (104.3 kg)  Height: 5\' 4"  (1.626 m)   Body mass index is 39.48 kg/m.     06/14/2023    4:03 PM 08/03/2022   10:33 AM 05/17/2021   11:04 AM 02/18/2019   12:45 AM 02/17/2019    6:37 PM 03/22/2018    7:10 AM 06/07/2017    5:52 PM  Advanced Directives  Does Patient Have a Medical Advance Directive? No No No No No No No  Would patient like information on creating a medical advance directive? Yes (MAU/Ambulatory/Procedural Areas - Information given) No - Patient declined Yes (ED - Information included in AVS) No - Patient declined No - Patient declined No - Patient declined     Current Medications (verified) Outpatient Encounter Medications as of 06/14/2023  Medication Sig   amLODipine (NORVASC) 10 MG tablet Take 1 tablet (10 mg total) by mouth daily.   APPLE CIDER VINEGAR PO Take 450 mg by mouth 3 (three) times daily.   aspirin 81 MG chewable tablet Chew 1 tablet (81 mg) by mouth daily.   atorvastatin (LIPITOR) 80 MG tablet Take 1 tablet (80 mg total) by mouth daily at 6 PM.   mupirocin ointment (BACTROBAN)  2 % Apply 1 Application topically 2 (two) times daily.   sulfamethoxazole-trimethoprim (BACTRIM DS) 800-160 MG tablet Take 1 tablet by mouth 2 (two) times daily. (Patient not taking: Reported on 06/14/2023)   [DISCONTINUED] fluticasone (FLONASE) 50 MCG/ACT nasal spray Place 2 sprays into both nostrils daily.   [DISCONTINUED] nicotine (NICODERM CQ - DOSED IN MG/24 HOURS) 14 mg/24hr patch Place 1 patch (14 mg total) onto the skin daily. (Patient not taking: Reported on 01/20/2023)   [DISCONTINUED] prednisoLONE acetate (PRED FORTE) 1 % ophthalmic suspension Instill 1 drop into right eye four times a day AFTER SURGERY (Patient not taking: Reported on 09/23/2022)   No facility-administered encounter medications on file as of 06/14/2023.    Allergies (verified) Patient has no known allergies.   History: Past Medical History:  Diagnosis Date   Hypertension    Irregular heartbeat    Past Surgical History:  Procedure Laterality Date   BREAST SURGERY     KNEE SURGERY Right    Family History  Problem Relation Age of Onset   Cancer Mother    Lymphoma Sister    Social History   Socioeconomic History   Marital status: Single    Spouse name: Not on file   Number of children: Not on file   Years of education: Not on file   Highest education level: Not on file  Occupational History   Occupation: Unemployed  Tobacco Use   Smoking status: Every Day  Current packs/day: 0.25    Types: Cigarettes   Smokeless tobacco: Never  Vaping Use   Vaping status: Never Used  Substance and Sexual Activity   Alcohol use: No   Drug use: No   Sexual activity: Yes    Birth control/protection: Post-menopausal  Other Topics Concern   Not on file  Social History Narrative   Not on file   Social Determinants of Health   Financial Resource Strain: Low Risk  (06/14/2023)   Overall Financial Resource Strain (CARDIA)    Difficulty of Paying Living Expenses: Not hard at all  Food Insecurity: No Food  Insecurity (06/14/2023)   Hunger Vital Sign    Worried About Running Out of Food in the Last Year: Never true    Ran Out of Food in the Last Year: Never true  Transportation Needs: No Transportation Needs (06/14/2023)   PRAPARE - Administrator, Civil Service (Medical): No    Lack of Transportation (Non-Medical): No  Physical Activity: Insufficiently Active (06/14/2023)   Exercise Vital Sign    Days of Exercise per Week: 3 days    Minutes of Exercise per Session: 30 min  Stress: No Stress Concern Present (06/14/2023)   [employer] of Occupational Health - Occupational Stress Questionnaire    Feeling of Stress : Not at all  Social Connections: Socially Integrated (06/14/2023)   Social Connection and Isolation Panel [NHANES]    Frequency of Communication with Friends and Family: More than three times a week    Frequency of Social Gatherings with Friends and Family: Three times a week    Attends Religious Services: More than 4 times per year    Active Member of Clubs or Organizations: Yes    Attends Engineer, structural: More than 4 times per year    Marital Status: Married    Tobacco Counseling Ready to quit: Not Answered Counseling given: Not Answered   Clinical Intake:  Pre-visit preparation completed: Yes  Pain : No/denies pain     Diabetes: No  How often do you need to have someone help you when you read instructions, pamphlets, or other written materials from your doctor or pharmacy?: 1 - Never  Interpreter Needed?: No  Information entered by :: Kandis Fantasia LPN   Activities of Daily Living    06/14/2023    4:00 PM 08/03/2022   10:33 AM  In your present state of health, do you have any difficulty performing the following activities:  Hearing? 0 0  Vision? 0 0  Difficulty concentrating or making decisions? 0 0  Walking or climbing stairs? 0 0  Dressing or bathing? 0 0  Doing errands, shopping? 0 0  Preparing Food and eating ? N N   Using the Toilet? N N  In the past six months, have you accidently leaked urine? N N  Do you have problems with loss of bowel control? N N  Managing your Medications? N N  Managing your Finances? N N  Housekeeping or managing your Housekeeping? N N    Patient Care Team: Marcine Matar, MD as PCP - General (Internal Medicine)  Indicate any recent Medical Services you may have received from other than [facility] providers in the past year (date may be approximate).     Assessment:   This is a routine wellness examination for Sheila Webster.  Hearing/Vision screen Hearing Screening - Comments:: Denies hearing difficulties   Vision Screening - Comments:: No vision problems; will schedule routine eye exam soon  Goals Addressed             This Visit's Progress    Remain active and independent        Depression Screen    06/14/2023    3:42 PM 01/20/2023    8:50 AM 09/23/2022    8:45 AM 08/03/2022   10:33 AM 05/24/2022    9:14 AM 11/12/2021    9:21 AM 05/17/2021   11:06 AM  PHQ 2/9 Scores  PHQ - 2 Score 0 0 0 0 0 0 0  PHQ- 9 Score  0 0  0 0 0    Fall Risk    06/14/2023    3:59 PM 01/20/2023    8:50 AM 08/03/2022   10:33 AM 05/24/2022    9:09 AM 05/17/2021   11:06 AM  Fall Risk   Falls in the past year? 0 0 0 0 0  Number falls in past yr: 0 0 0 0 0  Injury with Fall? 0 0 0 0 1  Risk for fall due to : No Fall Risks   No Fall Risks No Fall Risks  Follow up Falls prevention discussed;Education provided;Falls evaluation completed  Falls evaluation completed;Education provided;Falls prevention discussed  Education provided    MEDICARE RISK AT HOME: Medicare Risk at Home Any stairs in or around the home?: No If so, are there any without handrails?: No Home free of loose throw rugs in walkways, pet beds, electrical cords, etc?: Yes Adequate lighting in your home to reduce risk of falls?: Yes Life alert?: No Use of a cane, walker or w/c?: No Grab bars in the bathroom?:  Yes Shower chair or bench in shower?: No Elevated toilet seat or a handicapped toilet?: Yes  TIMED UP AND GO:  Was the test performed?  No    Cognitive Function:    08/03/2022   10:34 AM  MMSE - Mini Mental State Exam  Orientation to time 5  Orientation to Place 5  Registration 3  Attention/ Calculation 5  Recall 3  Language- name 2 objects 2  Language- repeat 1  Language- follow 3 step command 3  Language- read & follow direction 1  Write a sentence 1  Copy design 1  Total score 30        06/14/2023    4:03 PM 05/17/2021   11:24 AM 05/17/2021   11:05 AM  6CIT Screen  What Year? 0 points  0 points  What month? 0 points 0 points 0 points  What time? 0 points 0 points 0 points  Count back from 20 0 points 0 points 0 points  Months in reverse 0 points 0 points 0 points  Repeat phrase 0 points 0 points 0 points  Total Score 0 points  0 points    Immunizations Immunization History  Administered Date(s) Administered   Influenza,inj,Quad PF,6+ Mos 05/24/2022   Janssen (J&J) SARS-COV-2 Vaccination 11/03/2019   PNEUMOCOCCAL CONJUGATE-20 08/03/2022   Tdap 08/03/2022   Zoster Recombinant(Shingrix) 03/08/2022, 08/13/2022    TDAP status: Up to date  Flu Vaccine status: Due, Education has been provided regarding the importance of this vaccine. Advised may receive this vaccine at local pharmacy or Health Dept. Aware to provide a copy of the vaccination record if obtained from local pharmacy or Health Dept. Verbalized acceptance and understanding.  Pneumococcal vaccine status: Up to date  Covid-19 vaccine status: Information provided on how to obtain vaccines.   Qualifies for Shingles Vaccine? Yes   Zostavax completed No  Shingrix Completed?: Yes  Screening Tests Health Maintenance  Topic Date Due   Colonoscopy  Never done   MAMMOGRAM  Never done   DEXA SCAN  Never done   INFLUENZA VACCINE  03/31/2023   COVID-19 Vaccine (2 - 2023-24 season) 05/01/2023   Medicare  Annual Wellness (AWV)  06/13/2024   DTaP/Tdap/Td (2 - Td or Tdap) 08/03/2032   Pneumonia Vaccine 71+ Years old  Completed   Hepatitis C Screening  Completed   Zoster Vaccines- Shingrix  Completed   HPV VACCINES  Aged Out    Health Maintenance  Health Maintenance Due  Topic Date Due   Colonoscopy  Never done   MAMMOGRAM  Never done   DEXA SCAN  Never done   INFLUENZA VACCINE  03/31/2023   COVID-19 Vaccine (2 - 2023-24 season) 05/01/2023    Colorectal cancer screening:  Patient declines at this time   Mammogram status: Declines at this time   Bone density status: Declines at this time   Lung Cancer Screening: (Low Dose CT Chest recommended if Age 37-80 years, 20 pack-year currently smoking OR have quit w/in 15years.) does not qualify.   Lung Cancer Screening Referral: n/a  Additional Screening:  Hepatitis C Screening: does qualify; Completed 08/03/22  Vision Screening: Recommended annual ophthalmology exams for early detection of glaucoma and other disorders of the eye. Is the patient up to date with their annual eye exam?  No  Who is the provider or what is the name of the office in which the patient attends annual eye exams? none If pt is not established with a provider, would they like to be referred to a provider to establish care? No .   Dental Screening: Recommended annual dental exams for proper oral hygiene  Community Resource Referral / Chronic Care Management: CRR required this visit?  No   CCM required this visit?  No     Plan:     I have personally reviewed and noted the following in the patient's chart:   Medical and social history Use of alcohol, tobacco or illicit drugs  Current medications and supplements including opioid prescriptions. Patient is not currently taking opioid prescriptions. Functional ability and status Nutritional status Physical activity Advanced directives List of other physicians Hospitalizations, surgeries, and ER visits in  previous 12 months Vitals Screenings to include cognitive, depression, and falls Referrals and appointments  In addition, I have reviewed and discussed with patient certain preventive protocols, quality metrics, and best practice recommendations. A written personalized care plan for preventive services as well as general preventive health recommendations were provided to patient.     Kandis Fantasia [place], [place]   16/05/[id number]   After Visit Summary: (MyChart) Due to this being a telephonic visit, the after visit summary with patients personalized plan was offered to patient via MyChart   Nurse Notes: No concerns at this time

## 2023-06-22 ENCOUNTER — Other Ambulatory Visit: Payer: Self-pay

## 2023-07-19 ENCOUNTER — Other Ambulatory Visit (HOSPITAL_COMMUNITY): Payer: Self-pay

## 2023-07-21 ENCOUNTER — Ambulatory Visit: Payer: 59 | Admitting: Internal Medicine

## 2023-07-25 ENCOUNTER — Other Ambulatory Visit: Payer: Self-pay

## 2023-07-25 ENCOUNTER — Encounter: Payer: Self-pay | Admitting: Internal Medicine

## 2023-07-25 ENCOUNTER — Other Ambulatory Visit (HOSPITAL_COMMUNITY): Payer: Self-pay

## 2023-07-25 ENCOUNTER — Ambulatory Visit: Payer: 59 | Attending: Internal Medicine | Admitting: Internal Medicine

## 2023-07-25 VITALS — BP 109/76 | HR 72 | Temp 98.2°F | Ht 64.0 in | Wt 232.0 lb

## 2023-07-25 DIAGNOSIS — F17200 Nicotine dependence, unspecified, uncomplicated: Secondary | ICD-10-CM | POA: Diagnosis not present

## 2023-07-25 DIAGNOSIS — E7800 Pure hypercholesterolemia, unspecified: Secondary | ICD-10-CM | POA: Diagnosis not present

## 2023-07-25 DIAGNOSIS — I10 Essential (primary) hypertension: Secondary | ICD-10-CM

## 2023-07-25 DIAGNOSIS — L304 Erythema intertrigo: Secondary | ICD-10-CM

## 2023-07-25 DIAGNOSIS — D72829 Elevated white blood cell count, unspecified: Secondary | ICD-10-CM

## 2023-07-25 DIAGNOSIS — R7303 Prediabetes: Secondary | ICD-10-CM

## 2023-07-25 DIAGNOSIS — Z5320 Procedure and treatment not carried out because of patient's decision for unspecified reasons: Secondary | ICD-10-CM | POA: Diagnosis not present

## 2023-07-25 DIAGNOSIS — E66812 Obesity, class 2: Secondary | ICD-10-CM

## 2023-07-25 DIAGNOSIS — L732 Hidradenitis suppurativa: Secondary | ICD-10-CM

## 2023-07-25 DIAGNOSIS — Z6839 Body mass index (BMI) 39.0-39.9, adult: Secondary | ICD-10-CM

## 2023-07-25 MED ORDER — NYSTATIN 100000 UNIT/GM EX POWD
1.0000 | Freq: Two times a day (BID) | CUTANEOUS | 2 refills | Status: DC
  Filled 2023-07-25 (×2): qty 15, 8d supply, fill #0
  Filled 2023-08-10: qty 15, 8d supply, fill #1
  Filled 2023-10-12: qty 15, 8d supply, fill #2

## 2023-07-25 MED ORDER — ATORVASTATIN CALCIUM 80 MG PO TABS
80.0000 mg | ORAL_TABLET | Freq: Every day | ORAL | 1 refills | Status: DC
  Filled 2023-07-25 – 2023-09-20 (×2): qty 90, 90d supply, fill #0

## 2023-07-25 MED ORDER — SULFAMETHOXAZOLE-TRIMETHOPRIM 800-160 MG PO TABS
1.0000 | ORAL_TABLET | Freq: Two times a day (BID) | ORAL | 0 refills | Status: DC
  Filled 2023-07-25 (×2): qty 14, 7d supply, fill #0

## 2023-07-25 MED ORDER — AMLODIPINE BESYLATE 10 MG PO TABS
10.0000 mg | ORAL_TABLET | Freq: Every day | ORAL | 1 refills | Status: DC
  Filled 2023-07-25 – 2023-08-10 (×2): qty 90, 90d supply, fill #0

## 2023-07-25 NOTE — Progress Notes (Signed)
Patient ID: Sheila Webster, female    DOB: 1956-04-13  MRN: [id number]  CC: Hypertension (HTN f/u./Patient requesting refill on Bactrim /Already received flu vax - will send record)   Subjective: Sheila Webster is a 67 y.o. female who presents for chronic ds management. Her concerns today include:  Patient with history of CVA (LT mid corona radiata infarct), tobacco dependence, HTN, hidradenitis   Discussed the use of AI scribe software for clinical note transcription with the patient, who gave verbal consent to proceed.  History of Present Illness   Sheila Webster, a patient with hypertension, hyperlipidemia, prediabetes, and hidradenitis suppurativa, presents for medication refills and health maintenance.  HTN/HL:   She reports adherence to her current regimen of amlodipine 10mg  daily for hypertension and atorvastatin for hyperlipidemia. She checks her blood pressure regularly, reporting readings in the range of 109/71 to 121/81. She denies chest pain, shortness of breath, and leg swelling. Last LDL 72 08/2022.  Obesity/PreDM:  She has been trying to manage her weight and prediabetes through dietary changes, including reducing soda intake and eating salads daily. However, she reports difficulty losing weight despite these changes. She is not currently engaging in regular physical activity beyond household chores and short walks to her mailbox. -A1C in May was 6.0  Tob dep:  She continues to smoke cigarettes and is not currently interested in quitting.   She has a history of recurrent skin infections, specifically hidradenitis suppurativa. Gets outbreaks at times in axilla and groin.  Request RF on Bactrim to keep on hand. Referred to dermatology at [facility] in January of this year.  She was called and declined the appointment. She currently has a rash in her abdominal fold, which she has been managing with Vaseline. -She has had a mild leukocytosis on last 2 CBCs.  May be related to  chronic inflammation of the skin.  HM: Had flu show 2 mths ago at [facility] on [address].  Declines MMG and Colon CA screen.  Declines BMD, Patient Active Problem List   Diagnosis Date Noted   Class 2 severe obesity due to excess calories with serious comorbidity and body mass index (BMI) of 39.0 to 39.9 in adult [facility]) 09/23/2022   Hidradenitis suppurativa 04/13/2019   Obesity (BMI 35.0-39.9 without comorbidity) 04/12/2019   Tobacco dependence 04/12/2019   History of CVA (cerebrovascular accident) without residual deficits 04/12/2019   Pure hypercholesterolemia    CVA (cerebral vascular accident) (HCC) 02/17/2019   Essential hypertension    Tobacco use      Current Outpatient Medications on File Prior to Visit  Medication Sig Dispense Refill   amLODipine (NORVASC) 10 MG tablet Take 1 tablet (10 mg total) by mouth daily. 90 tablet 1   APPLE CIDER VINEGAR PO Take 450 mg by mouth 3 (three) times daily.     aspirin 81 MG chewable tablet Chew 1 tablet (81 mg) by mouth daily. 30 tablet 11   atorvastatin (LIPITOR) 80 MG tablet Take 1 tablet (80 mg total) by mouth daily at 6 PM. 90 tablet 1   mupirocin ointment (BACTROBAN) 2 % Apply 1 Application topically 2 (two) times daily. (Patient not taking: Reported on 07/25/2023) 22 g 1   sulfamethoxazole-trimethoprim (BACTRIM DS) 800-160 MG tablet Take 1 tablet by mouth 2 (two) times daily. (Patient not taking: Reported on 07/25/2023) 14 tablet 0   No current facility-administered medications on file prior to visit.    No Known Allergies  Social History   Socioeconomic History  Marital status: Single    Spouse name: Not on file   Number of children: Not on file   Years of education: Not on file   Highest education level: Not on file  Occupational History   Occupation: Unemployed  Tobacco Use   Smoking status: Every Day    Current packs/day: 0.25    Types: Cigarettes   Smokeless tobacco: Never  Vaping Use   Vaping status: Never Used   Substance and Sexual Activity   Alcohol use: No   Drug use: No   Sexual activity: Yes    Birth control/protection: Post-menopausal  Other Topics Concern   Not on file  Social History Narrative   Not on file   Social Determinants of Health   Financial Resource Strain: Low Risk  (06/14/2023)   Overall Financial Resource Strain (CARDIA)    Difficulty of Paying Living Expenses: Not hard at all  Food Insecurity: No Food Insecurity (06/14/2023)   Hunger Vital Sign    Worried About Running Out of Food in the Last Year: Never true    Ran Out of Food in the Last Year: Never true  Transportation Needs: No Transportation Needs (06/14/2023)   PRAPARE - Administrator, Civil Service (Medical): No    Lack of Transportation (Non-Medical): No  Physical Activity: Insufficiently Active (06/14/2023)   Exercise Vital Sign    Days of Exercise per Week: 3 days    Minutes of Exercise per Session: 30 min  Stress: No Stress Concern Present (06/14/2023)   [employer] of Occupational Health - Occupational Stress Questionnaire    Feeling of Stress : Not at all  Social Connections: Socially Integrated (06/14/2023)   Social Connection and Isolation Panel [NHANES]    Frequency of Communication with Friends and Family: More than three times a week    Frequency of Social Gatherings with Friends and Family: Three times a week    Attends Religious Services: More than 4 times per year    Active Member of Clubs or Organizations: Yes    Attends Banker Meetings: More than 4 times per year    Marital Status: Married  Catering manager Violence: Not At Risk (06/14/2023)   Humiliation, Afraid, Rape, and Kick questionnaire    Fear of Current or Ex-Partner: No    Emotionally Abused: No    Physically Abused: No    Sexually Abused: No    Family History  Problem Relation Age of Onset   Cancer Mother    Lymphoma Sister     Past Surgical History:  Procedure Laterality Date    BREAST SURGERY     KNEE SURGERY Right     ROS: Review of Systems Negative except as stated above  PHYSICAL EXAM: BP 109/76 (BP Location: Left Arm, Patient Position: Sitting, Cuff Size: Large)   Pulse 72   Temp 98.2 F (36.8 C) (Oral)   Ht 5\' 4"  (1.626 m)   Wt 232 lb (105.2 kg)   SpO2 96%   BMI 39.82 kg/m   Wt Readings from Last 3 Encounters:  07/25/23 232 lb (105.2 kg)  06/14/23 230 lb (104.3 kg)  01/20/23 230 lb (104.3 kg)    Physical Exam  General appearance - alert, well appearing, and in no distress Mental status - normal mood, behavior, speech, dress, motor activity, and thought processes Neck - supple, no significant adenopathy Chest - clear to auscultation, no wheezes, rales or rhonchi, symmetric air entry Heart - normal rate, regular rhythm, normal S1,  S2, no murmurs, rubs, clicks or gallops Extremities - no LE edema Skin -patient noted to have moderate scarring of skin in both axilla.  She also has scarring of skin in the groin area bilaterally.  Skin under the abdominal fold is moist; looks like from application of Vaseline.  Slight dusky appearance to the skin.      Latest Ref Rng & Units 09/23/2022    9:18 AM 11/12/2021    9:48 AM 04/16/2021    3:31 PM  CMP  Glucose 70 - 99 mg/dL 84  91  97   BUN 8 - 27 mg/dL 10  11  14    Creatinine 0.57 - 1.00 mg/dL 4.09  8.11  9.14   Sodium 134 - 144 mmol/L 141  139  138   Potassium 3.5 - 5.2 mmol/L 4.5  4.3  4.5   Chloride 96 - 106 mmol/L 104  103  103   CO2 20 - 29 mmol/L 20  23  24    Calcium 8.7 - 10.3 mg/dL 9.2  9.0  9.2   Total Protein 6.0 - 8.5 g/dL 7.1  6.7  7.0   Total Bilirubin 0.0 - 1.2 mg/dL 0.3  0.2  <7.8   Alkaline Phos 44 - 121 IU/L 122  110  101   AST 0 - 40 IU/L 13  15  11    ALT 0 - 32 IU/L 14  22  10     Lipid Panel     Component Value Date/Time   CHOL 136 09/23/2022 0918   TRIG 92 09/23/2022 0918   HDL 47 09/23/2022 0918   CHOLHDL 2.9 09/23/2022 0918   CHOLHDL 4.4 02/18/2019 0332   VLDL 17  02/18/2019 0332   LDLCALC 72 09/23/2022 0918    CBC    Component Value Date/Time   WBC 12.7 (H) 09/23/2022 0918   WBC 9.3 02/18/2019 1123   RBC 5.02 09/23/2022 0918   RBC 4.54 02/18/2019 1123   HGB 12.8 09/23/2022 0918   HCT 40.7 09/23/2022 0918   PLT 392 09/23/2022 0918   MCV 81 09/23/2022 0918   MCH 25.5 (L) 09/23/2022 0918   MCH 25.6 (L) 02/18/2019 1123   MCHC 31.4 (L) 09/23/2022 0918   MCHC 30.6 02/18/2019 1123   RDW 14.0 09/23/2022 0918   LYMPHSABS 2.1 11/12/2021 0948   MONOABS 0.7 02/17/2019 1955   EOSABS 0.2 11/12/2021 0948   BASOSABS 0.1 11/12/2021 0948    ASSESSMENT AND PLAN: 1. Essential hypertension At goal.  Continue Norvasc 10 mg daily - amLODipine (NORVASC) 10 MG tablet; Take 1 tablet (10 mg total) by mouth daily.  Dispense: 90 tablet; Refill: 1 - CBC With Diff/Platelet  2. Pure hypercholesterolemia Last LDL was at goal.  Continue atorvastatin.  Due for lipid profile and chemistry check. - atorvastatin (LIPITOR) 80 MG tablet; Take 1 tablet (80 mg total) by mouth daily at 6 PM.  Dispense: 90 tablet; Refill: 1 - Lipid panel  3. Class 2 severe obesity due to excess calories with serious comorbidity and body mass index (BMI) of 39.0 to 39.9 in adult [facility]) Commended home on changes that she is made in her eating habits so far.  She feels she has not made much progress in terms of weight loss.  Recommend referral to medical weight management or a nutritionist.  Patient declined. Encouraged her to move more with goal of getting in at least 30 minutes of walking 3 to 5 days a week. - Comprehensive metabolic panel - Hemoglobin A1c  4. Prediabetes See #3 above - Hemoglobin A1c  5. Tobacco dependence Advised to quit.  Discussed health risks of smoking.  Patient not ready to give a trial of quitting  6. Hidradenitis suppurativa Recommend referral to dermatology.  She may qualify for biologic to help prevent frequent flareups.  She is agreeable to me resubmitting  the referral. - Ambulatory referral to Dermatology - sulfamethoxazole-trimethoprim (BACTRIM DS) 800-160 MG tablet; Take 1 tablet by mouth 2 (two) times daily.  Dispense: 14 tablet; Refill: 0  7. Intertrigo Below the abdominal fold.  Advised to keep the area clean and dry. - nystatin (MYCOSTATIN/NYSTOP) powder; Apply 1 Application topically 2 (two) times daily.  Dispense: 15 g; Refill: 2  8. Leukocytosis, unspecified type - CBC With Diff/Platelet  9. Mammogram declined Recommended.  Patient declined.  10. Osteoporosis screening declined Recommended.  Patient declined  11. Colon cancer screening declined Recommended.  Patient declined    Patient was given the opportunity to ask questions.  Patient verbalized understanding of the plan and was able to repeat key elements of the plan.   This documentation was completed using Paediatric nurse.  Any transcriptional errors are unintentional.  No orders of the defined types were placed in this encounter.    Requested Prescriptions    No prescriptions requested or ordered in this encounter    No follow-ups on file.  Jonah Blue, MD, FACP

## 2023-07-25 NOTE — Patient Instructions (Signed)
I have submitted a referral for you to see the dermatologist.  They will call you with this appointment.  Healthy Eating, Adult Healthy eating may help you get and keep a healthy body weight, reduce the risk of chronic disease, and live a long and productive life. It is important to follow a healthy eating pattern. Your nutritional and calorie needs should be met mainly by different nutrient-rich foods. What are tips for following this plan? Reading food labels Read labels and choose the following: Reduced or low sodium products. Juices with 100% fruit juice. Foods with low saturated fats (<3 g per serving) and high polyunsaturated and monounsaturated fats. Foods with whole grains, such as whole wheat, cracked wheat, brown rice, and wild rice. Whole grains that are fortified with folic acid. This is recommended for females who are pregnant or who want to become pregnant. Read labels and do not eat or drink the following: Foods or drinks with added sugars. These include foods that contain brown sugar, corn sweetener, corn syrup, dextrose, fructose, glucose, high-fructose corn syrup, honey, invert sugar, lactose, malt syrup, maltose, molasses, raw sugar, sucrose, trehalose, or turbinado sugar. Limit your intake of added sugars to less than 10% of your total daily calories. Do not eat more than the following amounts of added sugar per day: 6 teaspoons (25 g) for females. 9 teaspoons (38 g) for males. Foods that contain processed or refined starches and grains. Refined grain products, such as white flour, degermed cornmeal, white bread, and white rice. Shopping Choose nutrient-rich snacks, such as vegetables, whole fruits, and nuts. Avoid high-calorie and high-sugar snacks, such as potato chips, fruit snacks, and candy. Use oil-based dressings and spreads on foods instead of solid fats such as butter, margarine, sour cream, or cream cheese. Limit pre-made sauces, mixes, and "instant" products such  as flavored rice, instant noodles, and ready-made pasta. Try more plant-protein sources, such as tofu, tempeh, black beans, edamame, lentils, nuts, and seeds. Explore eating plans such as the Mediterranean diet or vegetarian diet. Try heart-healthy dips made with beans and healthy fats like hummus and guacamole. Vegetables go great with these. Cooking Use oil to saut or stir-fry foods instead of solid fats such as butter, margarine, or lard. Try baking, boiling, grilling, or broiling instead of frying. Remove the fatty part of meats before cooking. Steam vegetables in water or broth. Meal planning  At meals, imagine dividing your plate into fourths: One-half of your plate is fruits and vegetables. One-fourth of your plate is whole grains. One-fourth of your plate is protein, especially lean meats, poultry, eggs, tofu, beans, or nuts. Include low-fat dairy as part of your daily diet. Lifestyle Choose healthy options in all settings, including home, work, school, restaurants, or stores. Prepare your food safely: Wash your hands after handling raw meats. Where you prepare food, keep surfaces clean by regularly washing with hot, soapy water. Keep raw meats separate from ready-to-eat foods, such as fruits and vegetables. Cook seafood, meat, poultry, and eggs to the recommended temperature. Get a food thermometer. Store foods at safe temperatures. In general: Keep cold foods at 64F (4.4C) or below. Keep hot foods at 164F (60C) or above. Keep your freezer at [facility] (-17.8C) or below. Foods are not safe to eat if they have been between the temperatures of 40-164F (4.4-60C) for more than 2 hours. What foods should I eat? Fruits Aim to eat 1-2 cups of fresh, canned (in natural juice), or frozen fruits each day. One cup of fruit equals 1  small apple, 1 large banana, 8 large strawberries, 1 cup (237 g) canned fruit,  cup (82 g) dried fruit, or 1 cup (240 mL) 100% juice. Vegetables Aim to  eat 2-4 cups of fresh and frozen vegetables each day, including different varieties and colors. One cup of vegetables equals 1 cup (91 g) broccoli or cauliflower florets, 2 medium carrots, 2 cups (150 g) raw, leafy greens, 1 large tomato, 1 large bell pepper, 1 large sweet potato, or 1 medium white potato. Grains Aim to eat 5-10 ounce-equivalents of whole grains each day. Examples of 1 ounce-equivalent of grains include 1 slice of bread, 1 cup (40 g) ready-to-eat cereal, 3 cups (24 g) popcorn, or  cup (93 g) cooked rice. Meats and other proteins Try to eat 5-7 ounce-equivalents of protein each day. Examples of 1 ounce-equivalent of protein include 1 egg,  oz nuts (12 almonds, 24 pistachios, or 7 walnut halves), 1/4 cup (90 g) cooked beans, 6 tablespoons (90 g) hummus or 1 tablespoon (16 g) peanut butter. A cut of meat or fish that is the size of a deck of cards is about 3-4 ounce-equivalents (85 g). Of the protein you eat each week, try to have at least 8 sounce (227 g) of seafood. This is about 2 servings per week. This includes salmon, trout, herring, sardines, and anchovies. Dairy Aim to eat 3 cup-equivalents of fat-free or low-fat dairy each day. Examples of 1 cup-equivalent of dairy include 1 cup (240 mL) milk, 8 ounces (250 g) yogurt, 1 ounces (44 g) natural cheese, or 1 cup (240 mL) fortified soy milk. Fats and oils Aim for about 5 teaspoons (21 g) of fats and oils per day. Choose monounsaturated fats, such as canola and olive oils, mayonnaise made with olive oil or avocado oil, avocados, peanut butter, and most nuts, or polyunsaturated fats, such as sunflower, corn, and soybean oils, walnuts, pine nuts, sesame seeds, sunflower seeds, and flaxseed. Beverages Aim for 6 eight-ounce glasses of water per day. Limit coffee to 3-5 eight-ounce cups per day. Limit caffeinated beverages that have added calories, such as soda and energy drinks. If you drink alcohol: Limit how much you have to: 0-1  drink a day if you are female. 0-2 drinks a day if you are female. Know how much alcohol is in your drink. In the [place] one drink is one 12 oz bottle of beer (355 mL), one 5 oz glass of wine (148 mL), or one 1 oz glass of hard liquor (44 mL). Seasoning and other foods Try not to add too much salt to your food. Try using herbs and spices instead of salt. Try not to add sugar to food. This information is based on [place] nutrition guidelines. To learn more, visit DisposableN[employer].be. Exact amounts may vary. You may need different amounts. This information is not intended to replace advice given to you by your health care provider. Make sure you discuss any questions you have with your health care provider. Document Revised: 05/17/2022 Document Reviewed: 05/17/2022 Elsevier Patient Education  2024 [employer].

## 2023-07-26 ENCOUNTER — Other Ambulatory Visit: Payer: Self-pay

## 2023-07-26 LAB — CBC WITH DIFF/PLATELET
Basophils Absolute: 0.1 10*3/uL (ref 0.0–0.2)
Basos: 1 %
EOS (ABSOLUTE): 0.1 10*3/uL (ref 0.0–0.4)
Eos: 1 %
Hematocrit: 40.9 % (ref 34.0–46.6)
Hemoglobin: 13.2 g/dL (ref 11.1–15.9)
Immature Grans (Abs): 0 10*3/uL (ref 0.0–0.1)
Immature Granulocytes: 0 %
Lymphocytes Absolute: 2.2 10*3/uL (ref 0.7–3.1)
Lymphs: 17 %
MCH: 26.5 pg — ABNORMAL LOW (ref 26.6–33.0)
MCHC: 32.3 g/dL (ref 31.5–35.7)
MCV: 82 fL (ref 79–97)
Monocytes Absolute: 0.5 10*3/uL (ref 0.1–0.9)
Monocytes: 4 %
Neutrophils Absolute: 9.6 10*3/uL — ABNORMAL HIGH (ref 1.4–7.0)
Neutrophils: 77 %
Platelets: 371 10*3/uL (ref 150–450)
RBC: 4.99 x10E6/uL (ref 3.77–5.28)
RDW: 13.7 % (ref 11.7–15.4)
WBC: 12.5 10*3/uL — ABNORMAL HIGH (ref 3.4–10.8)

## 2023-07-26 LAB — COMPREHENSIVE METABOLIC PANEL
ALT: 13 [IU]/L (ref 0–32)
AST: 10 [IU]/L (ref 0–40)
Albumin: 3.9 g/dL (ref 3.9–4.9)
Alkaline Phosphatase: 115 [IU]/L (ref 44–121)
BUN/Creatinine Ratio: 26 (ref 12–28)
BUN: 15 mg/dL (ref 8–27)
Bilirubin Total: 0.3 mg/dL (ref 0.0–1.2)
CO2: 21 mmol/L (ref 20–29)
Calcium: 9 mg/dL (ref 8.7–10.3)
Chloride: 106 mmol/L (ref 96–106)
Creatinine, Ser: 0.58 mg/dL (ref 0.57–1.00)
Globulin, Total: 2.8 g/dL (ref 1.5–4.5)
Glucose: 92 mg/dL (ref 70–99)
Potassium: 4.4 mmol/L (ref 3.5–5.2)
Sodium: 141 mmol/L (ref 134–144)
Total Protein: 6.7 g/dL (ref 6.0–8.5)
eGFR: 99 mL/min/{1.73_m2} (ref >59)

## 2023-07-26 LAB — HEMOGLOBIN A1C
Est. average glucose Bld gHb Est-mCnc: 134 mg/dL
Hgb A1c MFr Bld: 6.3 % — ABNORMAL HIGH (ref 4.8–5.6)

## 2023-07-26 LAB — LIPID PANEL
Chol/HDL Ratio: 3.1 {ratio} (ref 0.0–4.4)
Cholesterol, Total: 130 mg/dL (ref 100–199)
HDL: 42 mg/dL (ref >39)
LDL Chol Calc (NIH): 75 mg/dL (ref 0–99)
Triglycerides: 63 mg/dL (ref 0–149)
VLDL Cholesterol Cal: 13 mg/dL (ref 5–40)

## 2023-08-10 ENCOUNTER — Other Ambulatory Visit: Payer: Self-pay

## 2023-08-10 ENCOUNTER — Other Ambulatory Visit: Payer: Self-pay | Admitting: Internal Medicine

## 2023-08-10 ENCOUNTER — Other Ambulatory Visit (HOSPITAL_COMMUNITY): Payer: Self-pay

## 2023-08-10 DIAGNOSIS — L732 Hidradenitis suppurativa: Secondary | ICD-10-CM

## 2023-08-26 MED ORDER — SULFAMETHOXAZOLE-TRIMETHOPRIM 800-160 MG PO TABS
1.0000 | ORAL_TABLET | Freq: Two times a day (BID) | ORAL | 0 refills | Status: DC
  Filled 2023-08-26: qty 14, 7d supply, fill #0

## 2023-08-27 ENCOUNTER — Other Ambulatory Visit (HOSPITAL_COMMUNITY): Payer: Self-pay

## 2023-09-20 ENCOUNTER — Other Ambulatory Visit: Payer: Self-pay

## 2023-09-20 ENCOUNTER — Other Ambulatory Visit (HOSPITAL_COMMUNITY): Payer: Self-pay

## 2023-10-12 ENCOUNTER — Other Ambulatory Visit (HOSPITAL_COMMUNITY): Payer: Self-pay

## 2023-11-22 ENCOUNTER — Ambulatory Visit: Payer: 59 | Attending: Internal Medicine | Admitting: Internal Medicine

## 2023-11-22 ENCOUNTER — Other Ambulatory Visit (HOSPITAL_COMMUNITY): Payer: Self-pay

## 2023-11-22 ENCOUNTER — Encounter: Payer: Self-pay | Admitting: Internal Medicine

## 2023-11-22 ENCOUNTER — Other Ambulatory Visit: Payer: Self-pay

## 2023-11-22 VITALS — BP 114/74 | HR 84 | Temp 98.0°F | Ht 64.0 in | Wt 224.0 lb

## 2023-11-22 DIAGNOSIS — F17210 Nicotine dependence, cigarettes, uncomplicated: Secondary | ICD-10-CM

## 2023-11-22 DIAGNOSIS — E66812 Obesity, class 2: Secondary | ICD-10-CM

## 2023-11-22 DIAGNOSIS — I10 Essential (primary) hypertension: Secondary | ICD-10-CM | POA: Diagnosis not present

## 2023-11-22 DIAGNOSIS — F17200 Nicotine dependence, unspecified, uncomplicated: Secondary | ICD-10-CM

## 2023-11-22 DIAGNOSIS — L732 Hidradenitis suppurativa: Secondary | ICD-10-CM

## 2023-11-22 DIAGNOSIS — E7800 Pure hypercholesterolemia, unspecified: Secondary | ICD-10-CM

## 2023-11-22 DIAGNOSIS — Z6838 Body mass index (BMI) 38.0-38.9, adult: Secondary | ICD-10-CM

## 2023-11-22 MED ORDER — SULFAMETHOXAZOLE-TRIMETHOPRIM 800-160 MG PO TABS
1.0000 | ORAL_TABLET | Freq: Two times a day (BID) | ORAL | 2 refills | Status: DC
  Filled 2023-11-22 (×4): qty 14, 7d supply, fill #0
  Filled 2023-11-24 – 2023-12-14 (×2): qty 14, 7d supply, fill #1
  Filled 2024-01-09: qty 14, 7d supply, fill #2

## 2023-11-22 MED ORDER — ATORVASTATIN CALCIUM 80 MG PO TABS
80.0000 mg | ORAL_TABLET | Freq: Every day | ORAL | 1 refills | Status: DC
  Filled 2023-11-22 – 2023-12-14 (×3): qty 90, 90d supply, fill #0
  Filled 2024-03-17: qty 90, 90d supply, fill #1

## 2023-11-22 MED ORDER — AMLODIPINE BESYLATE 10 MG PO TABS
10.0000 mg | ORAL_TABLET | Freq: Every day | ORAL | 1 refills | Status: DC
  Filled 2023-11-22 (×4): qty 90, 90d supply, fill #0
  Filled 2024-02-15: qty 90, 90d supply, fill #1

## 2023-11-22 NOTE — Progress Notes (Signed)
 Patient ID: Sheila Webster, female    DOB: 1956-04-15  MRN: [id number]  CC: Hypertension (HTN f/u. Herold Harms refill for bactrim/No to flu vax)   Subjective: Sheila Webster is a 68 y.o. female who presents for chronic ds management. Her concerns today include:  Patient with history of CVA (LT mid corona radiata infarct), tobacco dependence, HTN, hidradenitis   Discussed the use of AI scribe software for clinical note transcription with the patient, who gave verbal consent to proceed.  History of Present Illness   Sheila Webster is a 68 year old female with hypertension, hyperlipidemia, and prediabetes who presents for chronic disease management.  She was last seen four months ago. Her blood pressure is well-controlled with daily readings similar to today's 114/74 mmHg, and the highest being 135/83 mmHg. A recent home visit by a NP from her insurance company showed a blood pressure of 120/74 mmHg.She continues to take amlodipine 10 mg daily and limits her salt intake.  HL:  For hyperlipidemia, she continues to take atorvastatin 80 mg daily.   Regarding prediabetes and obesity, her A1c has improved from 6.3% six months ago to 5.6% recently. She has lost 8 pounds since November, now weighing 224 pounds. She attributes this to dietary changes, including giving up sodas, reducing sweets, and eating more salads. She bakes or air fries her food. She exercises by walking to her mailbox, which takes about ten minutes round trip, every other day.  For hidradenitis suppurativa, she did not attend a dermatology appointment because she was sick with a cold at that time.  She did not reschedule.  She is not really wanting to see the dermatologist since flareups have been not very frequent.  She reports infrequent flare-ups, with a small bump currently under her left arm that drains slightly. She uses Bactrim as needed and would like for me to give more with refills for her to keep on hand to use as needed  as it is very helpful. Applies Bactroban ointment for rawness. Her last Bactrim prescription was in December, and she uses the ointment as needed.  Tobacco dependence: She continues to smoke and is not ready to quit at this time.     HM: She declines colon cancer screening, mammogram  Patient Active Problem List   Diagnosis Date Noted   Class 2 severe obesity due to excess calories with serious comorbidity and body mass index (BMI) of 39.0 to 39.9 in adult [facility]) 09/23/2022   Hidradenitis suppurativa 04/13/2019   Obesity (BMI 35.0-39.9 without comorbidity) 04/12/2019   Tobacco dependence 04/12/2019   History of CVA (cerebrovascular accident) without residual deficits 04/12/2019   Pure hypercholesterolemia    CVA (cerebral vascular accident) (HCC) 02/17/2019   Essential hypertension    Tobacco use      Current Outpatient Medications on File Prior to Visit  Medication Sig Dispense Refill   APPLE CIDER VINEGAR PO Take 450 mg by mouth 3 (three) times daily.     aspirin 81 MG chewable tablet Chew 1 tablet (81 mg) by mouth daily. 30 tablet 11   nystatin (MYCOSTATIN/NYSTOP) powder Apply 1 Application topically 2 (two) times daily. 15 g 2   mupirocin ointment (BACTROBAN) 2 % Apply 1 Application topically 2 (two) times daily. (Patient not taking: Reported on 11/22/2023) 22 g 1   No current facility-administered medications on file prior to visit.    No Known Allergies  Social History   Socioeconomic History   Marital status: Single  Spouse name: Not on file   Number of children: Not on file   Years of education: Not on file   Highest education level: Not on file  Occupational History   Occupation: Unemployed  Tobacco Use   Smoking status: Every Day    Current packs/day: 0.25    Types: Cigarettes   Smokeless tobacco: Never  Vaping Use   Vaping status: Never Used  Substance and Sexual Activity   Alcohol use: No   Drug use: No   Sexual activity: Yes    Birth  control/protection: Post-menopausal  Other Topics Concern   Not on file  Social History Narrative   Not on file   Social Drivers of Health   Financial Resource Strain: Low Risk  (06/14/2023)   Overall Financial Resource Strain (CARDIA)    Difficulty of Paying Living Expenses: Not hard at all  Food Insecurity: No Food Insecurity (06/14/2023)   Hunger Vital Sign    Worried About Running Out of Food in the Last Year: Never true    Ran Out of Food in the Last Year: Never true  Transportation Needs: No Transportation Needs (06/14/2023)   PRAPARE - Administrator, Civil Service (Medical): No    Lack of Transportation (Non-Medical): No  Physical Activity: Insufficiently Active (06/14/2023)   Exercise Vital Sign    Days of Exercise per Week: 3 days    Minutes of Exercise per Session: 30 min  Stress: No Stress Concern Present (06/14/2023)   [employer] of Occupational Health - Occupational Stress Questionnaire    Feeling of Stress : Not at all  Social Connections: Socially Integrated (06/14/2023)   Social Connection and Isolation Panel [NHANES]    Frequency of Communication with Friends and Family: More than three times a week    Frequency of Social Gatherings with Friends and Family: Three times a week    Attends Religious Services: More than 4 times per year    Active Member of Clubs or Organizations: Yes    Attends Banker Meetings: More than 4 times per year    Marital Status: Married  Catering manager Violence: Not At Risk (06/14/2023)   Humiliation, Afraid, Rape, and Kick questionnaire    Fear of Current or Ex-Partner: No    Emotionally Abused: No    Physically Abused: No    Sexually Abused: No    Family History  Problem Relation Age of Onset   Cancer Mother    Lymphoma Sister     Past Surgical History:  Procedure Laterality Date   BREAST SURGERY     KNEE SURGERY Right     ROS: Review of Systems Negative except as stated  above  PHYSICAL EXAM: BP 114/74 (BP Location: Left Arm, Patient Position: Sitting, Cuff Size: Normal)   Pulse 84   Temp 98 F (36.7 C) (Oral)   Ht 5\' 4"  (1.626 m)   Wt 224 lb (101.6 kg)   SpO2 97%   BMI 38.45 kg/m   Wt Readings from Last 3 Encounters:  11/22/23 224 lb (101.6 kg)  07/25/23 232 lb (105.2 kg)  06/14/23 230 lb (104.3 kg)    Physical Exam  General appearance - alert, well appearing, and in no distress Mental status - normal mood, behavior, speech, dress, motor activity, and thought processes Neck - supple, no significant adenopathy Chest - clear to auscultation, no wheezes, rales or rhonchi, symmetric air entry Heart - normal rate, regular rhythm, normal S1, S2, no murmurs, rubs, clicks or  gallops Extremities - peripheral pulses normal, no pedal edema, no clubbing or cyanosis Skin - significant scarring in both axilla.  No active boil or drainage noted      Latest Ref Rng & Units 07/25/2023    9:47 AM 09/23/2022    9:18 AM 11/12/2021    9:48 AM  CMP  Glucose 70 - 99 mg/dL 92  84  91   BUN 8 - 27 mg/dL 15  10  11    Creatinine 0.57 - 1.00 mg/dL 4.54  0.98  1.19   Sodium 134 - 144 mmol/L 141  141  139   Potassium 3.5 - 5.2 mmol/L 4.4  4.5  4.3   Chloride 96 - 106 mmol/L 106  104  103   CO2 20 - 29 mmol/L 21  20  23    Calcium 8.7 - 10.3 mg/dL 9.0  9.2  9.0   Total Protein 6.0 - 8.5 g/dL 6.7  7.1  6.7   Total Bilirubin 0.0 - 1.2 mg/dL 0.3  0.3  0.2   Alkaline Phos 44 - 121 IU/L 115  122  110   AST 0 - 40 IU/L 10  13  15    ALT 0 - 32 IU/L 13  14  22     Lipid Panel     Component Value Date/Time   CHOL 130 07/25/2023 0947   TRIG 63 07/25/2023 0947   HDL 42 07/25/2023 0947   CHOLHDL 3.1 07/25/2023 0947   CHOLHDL 4.4 02/18/2019 0332   VLDL 17 02/18/2019 0332   LDLCALC 75 07/25/2023 0947    CBC    Component Value Date/Time   WBC 12.5 (H) 07/25/2023 0947   WBC 9.3 02/18/2019 1123   RBC 4.99 07/25/2023 0947   RBC 4.54 02/18/2019 1123   HGB 13.2  07/25/2023 0947   HCT 40.9 07/25/2023 0947   PLT 371 07/25/2023 0947   MCV 82 07/25/2023 0947   MCH 26.5 (L) 07/25/2023 0947   MCH 25.6 (L) 02/18/2019 1123   MCHC 32.3 07/25/2023 0947   MCHC 30.6 02/18/2019 1123   RDW 13.7 07/25/2023 0947   LYMPHSABS 2.2 07/25/2023 0947   MONOABS 0.7 02/17/2019 1955   EOSABS 0.1 07/25/2023 0947   BASOSABS 0.1 07/25/2023 0947    ASSESSMENT AND PLAN: 1. Essential hypertension (Primary) At goal. Continue Norvasc 10 mg - amLODipine (NORVASC) 10 MG tablet; Take 1 tablet (10 mg total) by mouth daily.  Dispense: 90 tablet; Refill: 1  2. Pure hypercholesterolemia - atorvastatin (LIPITOR) 80 MG tablet; Take 1 tablet (80 mg total) by mouth daily at 6 PM.  Dispense: 90 tablet; Refill: 1  3. Hidradenitis suppurativa Encouraged her to reconsider about seeing a dermatologist to be considered for an immunologic like Humira to help control the condition. She is not interested.  Will give some Bactrim with refills for her to keep on hand and use as needed - sulfamethoxazole-trimethoprim (BACTRIM DS) 800-160 MG tablet; Take 1 tablet by mouth 2 (two) times daily.  Dispense: 14 tablet; Refill: 2  4. Tobacco dependence Advised to quit.  She is not ready to give a trial of quitting.  5. Class 2 severe obesity due to excess calories with serious comorbidity and body mass index (BMI) of 38.0 to 38.9 in adult [facility]) Commended her on weight loss and changes in eating habits.  Encouraged to keep up the good work.  Encouraged her to try to walk for 10 minutes daily instead of just every other day.  Assessment and Plan  Patient was given the opportunity to ask questions.  Patient verbalized understanding of the plan and was able to repeat key elements of the plan.   This documentation was completed using Paediatric nurse.  Any transcriptional errors are unintentional.  No orders of the defined types were placed in this encounter.    Requested  Prescriptions   Signed Prescriptions Disp Refills   amLODipine (NORVASC) 10 MG tablet 90 tablet 1    Sig: Take 1 tablet (10 mg total) by mouth daily.   atorvastatin (LIPITOR) 80 MG tablet 90 tablet 1    Sig: Take 1 tablet (80 mg total) by mouth daily at 6 PM.   sulfamethoxazole-trimethoprim (BACTRIM DS) 800-160 MG tablet 14 tablet 2    Sig: Take 1 tablet by mouth 2 (two) times daily.    Return in about 4 months (around 03/23/2024).  Jonah Blue, MD, FACP

## 2023-11-22 NOTE — Patient Instructions (Addendum)
 VISIT SUMMARY:  Today, we reviewed your chronic conditions, including hypertension, hyperlipidemia, prediabetes, and hidradenitis suppurativa. Your blood pressure and cholesterol levels are well-controlled, and you are no longer prediabetic. We also discussed your current treatment plan and made some recommendations for further management.  YOUR PLAN:  -HIDRADENITIS SUPPURATIVA: Hidradenitis suppurativa is a skin condition that causes small, painful lumps under the skin. You are currently managing it with Bactrim and Bactroban. We discussed the potential benefits of seeing a dermatologist for advanced treatments like Humira. You will continue with Bactrim as needed and have a refill available.  -HYPERTENSION: Hypertension, or high blood pressure, is well-controlled with your current medication, amlodipine 10 mg daily. Your blood pressure readings are within the target range. Continue taking your medication and maintain your dietary salt restriction.  -HYPERLIPIDEMIA: Hyperlipidemia means having high levels of fats (lipids) in your blood. You are managing this condition well with atorvastatin 80 mg daily. Continue taking your medication as prescribed.  -Obesity: Your A1c has normalized so no longer in prediabetic range. Continue with your current diet, aim for further weight loss, and try to increase your walking to daily.  -GENERAL HEALTH MAINTENANCE: We discussed general health maintenance, including the importance of regular screenings like mammograms and bone density tests, which you declined. We also talked about smoking cessation options for when you are ready to quit.

## 2023-11-24 ENCOUNTER — Other Ambulatory Visit (HOSPITAL_COMMUNITY): Payer: Self-pay

## 2023-12-14 ENCOUNTER — Other Ambulatory Visit (HOSPITAL_COMMUNITY): Payer: Self-pay

## 2023-12-14 ENCOUNTER — Other Ambulatory Visit: Payer: Self-pay

## 2024-01-09 ENCOUNTER — Other Ambulatory Visit (HOSPITAL_COMMUNITY): Payer: Self-pay

## 2024-02-15 ENCOUNTER — Other Ambulatory Visit: Payer: Self-pay

## 2024-02-15 ENCOUNTER — Other Ambulatory Visit (HOSPITAL_COMMUNITY): Payer: Self-pay

## 2024-02-15 ENCOUNTER — Other Ambulatory Visit: Payer: Self-pay | Admitting: Internal Medicine

## 2024-02-15 DIAGNOSIS — L732 Hidradenitis suppurativa: Secondary | ICD-10-CM

## 2024-02-15 MED ORDER — SULFAMETHOXAZOLE-TRIMETHOPRIM 800-160 MG PO TABS
1.0000 | ORAL_TABLET | Freq: Two times a day (BID) | ORAL | 0 refills | Status: DC
  Filled 2024-02-15: qty 14, 7d supply, fill #0

## 2024-03-17 ENCOUNTER — Other Ambulatory Visit (HOSPITAL_COMMUNITY): Payer: Self-pay

## 2024-03-20 ENCOUNTER — Other Ambulatory Visit: Payer: Self-pay | Admitting: Internal Medicine

## 2024-03-20 DIAGNOSIS — L732 Hidradenitis suppurativa: Secondary | ICD-10-CM

## 2024-03-21 ENCOUNTER — Other Ambulatory Visit: Payer: Self-pay

## 2024-03-21 ENCOUNTER — Other Ambulatory Visit (HOSPITAL_COMMUNITY): Payer: Self-pay

## 2024-03-21 MED ORDER — SULFAMETHOXAZOLE-TRIMETHOPRIM 800-160 MG PO TABS
1.0000 | ORAL_TABLET | Freq: Two times a day (BID) | ORAL | 0 refills | Status: DC
  Filled 2024-03-21: qty 14, 7d supply, fill #0

## 2024-03-23 ENCOUNTER — Ambulatory Visit: Attending: Internal Medicine | Admitting: Internal Medicine

## 2024-03-23 ENCOUNTER — Encounter: Payer: Self-pay | Admitting: Internal Medicine

## 2024-03-23 VITALS — BP 105/70 | HR 65 | Temp 98.0°F | Ht 64.0 in | Wt 223.0 lb

## 2024-03-23 DIAGNOSIS — L732 Hidradenitis suppurativa: Secondary | ICD-10-CM

## 2024-03-23 DIAGNOSIS — E785 Hyperlipidemia, unspecified: Secondary | ICD-10-CM

## 2024-03-23 DIAGNOSIS — Z8673 Personal history of transient ischemic attack (TIA), and cerebral infarction without residual deficits: Secondary | ICD-10-CM

## 2024-03-23 DIAGNOSIS — F17210 Nicotine dependence, cigarettes, uncomplicated: Secondary | ICD-10-CM | POA: Diagnosis not present

## 2024-03-23 DIAGNOSIS — F17200 Nicotine dependence, unspecified, uncomplicated: Secondary | ICD-10-CM

## 2024-03-23 DIAGNOSIS — I10 Essential (primary) hypertension: Secondary | ICD-10-CM | POA: Diagnosis not present

## 2024-03-23 DIAGNOSIS — Z6838 Body mass index (BMI) 38.0-38.9, adult: Secondary | ICD-10-CM

## 2024-03-23 DIAGNOSIS — E7800 Pure hypercholesterolemia, unspecified: Secondary | ICD-10-CM

## 2024-03-23 DIAGNOSIS — E66812 Obesity, class 2: Secondary | ICD-10-CM

## 2024-03-23 NOTE — Progress Notes (Signed)
 Patient ID: Sheila Webster, female    DOB: April 24, 1956  MRN: [id number]  CC: Hypertension (HTN f/u. /No questions / concerns/)   Subjective: Sheila Webster is a 68 y.o. female who presents for chronic ds management. Her concerns today include:  Patient with history of CVA (LT mid corona radiata infarct), tobacco dependence, HTN, hidradenitis   Discussed the use of AI scribe software for clinical note transcription with the patient, who gave verbal consent to proceed.  History of Present Illness Sheila Webster is a 68 year old female with hypertension and hyperlipidemia who presents for a four-month follow-up.  She monitors her blood pressure at home every other day, with readings ranging from 105/77 to 134/77. She reports that she is still taking amlodipine  10 mg daily. No chest pain, shortness of breath, leg swelling, chronic headaches, or dizziness.  She reports taking atorvastatin  80 mg daily at 6 PM. She also reports taking aspirin  daily and has a history of stroke.  She has a history of hidradenitis suppurativa and reports no recent increase in flare frequency.  I have recommended referral to dermatology to explore use of one of the biologic agents but patient has declined.  I have prescribed Bactrim  1 week supply for her to take whenever she has flareup.  She takes Bactrim  sporadically, about one tablet every three days. She recently took one tablet due to a small flare under her left arm with some drainage.  Obesity: Her weight is currently 223 lbs; she reports previously being at 235 lbs. She has made dietary changes, such as reducing fried food intake and consuming more salads. She drinks diet soda every other day and avoids sugary drinks. She does not engage in regular exercise, citing the heat and her caregiving responsibilities for her mother as barriers. She spends most of her time caring for her 13 year old mother, who requires assistance with daily activities. She has six sisters  and four brothers, but primarily she and one sister provide care for their mother.  Tobacco dependence: She continues to smoke and is not ready to quit.     Patient Active Problem List   Diagnosis Date Noted   Class 2 severe obesity due to excess calories with serious comorbidity and body mass index (BMI) of 39.0 to 39.9 in adult [facility]) 09/23/2022   Hidradenitis suppurativa 04/13/2019   Obesity (BMI 35.0-39.9 without comorbidity) 04/12/2019   Tobacco dependence 04/12/2019   History of CVA (cerebrovascular accident) without residual deficits 04/12/2019   Pure hypercholesterolemia    CVA (cerebral vascular accident) (HCC) 02/17/2019   Essential hypertension    Tobacco use      Current Outpatient Medications on File Prior to Visit  Medication Sig Dispense Refill   amLODipine  (NORVASC ) 10 MG tablet Take 1 tablet (10 mg total) by mouth daily. 90 tablet 1   APPLE CIDER VINEGAR PO Take 450 mg by mouth 3 (three) times daily.     aspirin  81 MG chewable tablet Chew 1 tablet (81 mg) by mouth daily. 30 tablet 11   atorvastatin  (LIPITOR ) 80 MG tablet Take 1 tablet (80 mg total) by mouth daily at 6 PM. 90 tablet 1   sulfamethoxazole -trimethoprim  (BACTRIM  DS) 800-160 MG tablet Take 1 tablet by mouth 2 (two) times daily. 14 tablet 0   mupirocin  ointment (BACTROBAN ) 2 % Apply 1 Application topically 2 (two) times daily. (Patient not taking: Reported on 03/23/2024) 22 g 1   No current facility-administered medications on file prior to visit.  No Known Allergies  Social History   Socioeconomic History   Marital status: Single    Spouse name: Not on file   Number of children: Not on file   Years of education: Not on file   Highest education level: Not on file  Occupational History   Occupation: Unemployed  Tobacco Use   Smoking status: Every Day    Current packs/day: 0.25    Types: Cigarettes   Smokeless tobacco: Never  Vaping Use   Vaping status: Never Used  Substance and Sexual  Activity   Alcohol use: No   Drug use: No   Sexual activity: Yes    Birth control/protection: Post-menopausal  Other Topics Concern   Not on file  Social History Narrative   Not on file   Social Drivers of Health   Financial Resource Strain: Low Risk  (06/14/2023)   Overall Financial Resource Strain (CARDIA)    Difficulty of Paying Living Expenses: Not hard at all  Food Insecurity: No Food Insecurity (06/14/2023)   Hunger Vital Sign    Worried About Running Out of Food in the Last Year: Never true    Ran Out of Food in the Last Year: Never true  Transportation Needs: No Transportation Needs (06/14/2023)   PRAPARE - Administrator, Civil Service (Medical): No    Lack of Transportation (Non-Medical): No  Physical Activity: Insufficiently Active (06/14/2023)   Exercise Vital Sign    Days of Exercise per Week: 3 days    Minutes of Exercise per Session: 30 min  Stress: No Stress Concern Present (06/14/2023)   [employer] of Occupational Health - Occupational Stress Questionnaire    Feeling of Stress : Not at all  Social Connections: Socially Integrated (06/14/2023)   Social Connection and Isolation Panel    Frequency of Communication with Friends and Family: More than three times a week    Frequency of Social Gatherings with Friends and Family: Three times a week    Attends Religious Services: More than 4 times per year    Active Member of Clubs or Organizations: Yes    Attends Banker Meetings: More than 4 times per year    Marital Status: Married  Catering manager Violence: Not At Risk (06/14/2023)   Humiliation, Afraid, Rape, and Kick questionnaire    Fear of Current or Ex-Partner: No    Emotionally Abused: No    Physically Abused: No    Sexually Abused: No    Family History  Problem Relation Age of Onset   Cancer Mother    Lymphoma Sister     Past Surgical History:  Procedure Laterality Date   BREAST SURGERY     KNEE SURGERY Right      ROS: Review of Systems Negative except as stated above  PHYSICAL EXAM: BP 105/70 (BP Location: Left Arm, Patient Position: Sitting, Cuff Size: Normal)   Pulse 65   Temp 98 F (36.7 C) (Oral)   Ht 5' 4 (1.626 m)   Wt 223 lb (101.2 kg)   SpO2 96%   BMI 38.28 kg/m   Wt Readings from Last 3 Encounters:  03/23/24 223 lb (101.2 kg)  11/22/23 224 lb (101.6 kg)  07/25/23 232 lb (105.2 kg)    Physical Exam  General appearance - alert, well appearing, pleasant older African-American female and in no distress Mental status - normal mood, behavior, speech, dress, motor activity, and thought processes Neck - supple, no significant adenopathy Chest - clear to auscultation, no  wheezes, rales or rhonchi, symmetric air entry Heart - normal rate, regular rhythm, normal S1, S2, no murmurs, rubs, clicks or gallops Extremities - trace BL LE edema Skin -significant scarring of skin on the both arms without active drainage noted at this time.      Latest Ref Rng & Units 07/25/2023    9:47 AM 09/23/2022    9:18 AM 11/12/2021    9:48 AM  CMP  Glucose 70 - 99 mg/dL 92  84  91   BUN 8 - 27 mg/dL 15  10  11    Creatinine 0.57 - 1.00 mg/dL 9.41  9.37  9.26   Sodium 134 - 144 mmol/L 141  141  139   Potassium 3.5 - 5.2 mmol/L 4.4  4.5  4.3   Chloride 96 - 106 mmol/L 106  104  103   CO2 20 - 29 mmol/L 21  20  23    Calcium  8.7 - 10.3 mg/dL 9.0  9.2  9.0   Total Protein 6.0 - 8.5 g/dL 6.7  7.1  6.7   Total Bilirubin 0.0 - 1.2 mg/dL 0.3  0.3  0.2   Alkaline Phos 44 - 121 IU/L 115  122  110   AST 0 - 40 IU/L 10  13  15    ALT 0 - 32 IU/L 13  14  22     Lipid Panel     Component Value Date/Time   CHOL 130 07/25/2023 0947   TRIG 63 07/25/2023 0947   HDL 42 07/25/2023 0947   CHOLHDL 3.1 07/25/2023 0947   CHOLHDL 4.4 02/18/2019 0332   VLDL 17 02/18/2019 0332   LDLCALC 75 07/25/2023 0947    CBC    Component Value Date/Time   WBC 12.5 (H) 07/25/2023 0947   WBC 9.3 02/18/2019 1123   RBC  4.99 07/25/2023 0947   RBC 4.54 02/18/2019 1123   HGB 13.2 07/25/2023 0947   HCT 40.9 07/25/2023 0947   PLT 371 07/25/2023 0947   MCV 82 07/25/2023 0947   MCH 26.5 (L) 07/25/2023 0947   MCH 25.6 (L) 02/18/2019 1123   MCHC 32.3 07/25/2023 0947   MCHC 30.6 02/18/2019 1123   RDW 13.7 07/25/2023 0947   LYMPHSABS 2.2 07/25/2023 0947   MONOABS 0.7 02/17/2019 1955   EOSABS 0.1 07/25/2023 0947   BASOSABS 0.1 07/25/2023 0947    ASSESSMENT AND PLAN:  Assessment and Plan Assessment & Plan Hypertension Blood pressure well-controlled with current regimen. Occasional systolic readings 134-135 mmHg. Target <130/80 mmHg. - Continue amlodipine  10 mg daily. - Monitor blood pressure at home regularly.  Hyperlipidemia Cholesterol management ongoing with atorvastatin . No adherence issues with atorvastatin . - Continue atorvastatin  80 mg daily.  Hx Stroke Continue Lipitor  80 mg and Aspirin  daily  Obesity Weight stable. Dietary changes made. Limited exercise due to responsibilities. Encouraged home exercise and community resources. - Encourage regular physical activity, explore options like [employer] or [employer] and home exercise options such as [facility]. - Continue dietary modifications, avoid sugary drinks.  Tobacco Use Disorder Continues to smoke, not ready to quit. Advised cessation for health improvement. - Encourage smoking cessation.  Hidradenitis Suppurativa  Advised to take full course of Bactrim  during flares rather than taking one every 3rd day. Aware of new treatment options but she is concerned about side effects of biologics.  - Consider referral to dermatologist if interested in exploring new treatments.   Patient was given the opportunity to ask questions.  Patient verbalized understanding of the  plan and was able to repeat key elements of the plan.   This documentation was completed using Paediatric nurse.  Any transcriptional errors  are unintentional.  No orders of the defined types were placed in this encounter.    Requested Prescriptions    No prescriptions requested or ordered in this encounter    Return in about 4 months (around 07/24/2024).  Barnie Louder, MD, FACP

## 2024-03-23 NOTE — Patient Instructions (Signed)
 VISIT SUMMARY:  Today, you came in for your four-month follow-up appointment. We discussed your current health status, including your blood pressure, cholesterol, history of stroke, weight management, smoking, and hidradenitis suppurativa. You have made some positive dietary changes and are monitoring your blood pressure regularly. We also talked about the importance of regular exercise and smoking cessation.  YOUR PLAN:  -HYPERTENSION: Hypertension means high blood pressure. Your blood pressure is well-controlled with your current medication. Please continue taking amlodipine  10 mg daily and monitor your blood pressure at home regularly.  -HYPERLIPIDEMIA: Hyperlipidemia means high cholesterol levels. You are managing this well with atorvastatin . Please continue taking atorvastatin  80 mg daily.  -STROKE: You have a history of stroke and are taking aspirin  daily to prevent another stroke. Please continue taking your daily aspirin .  -OBESITY: Obesity means having excess body weight. You have made some dietary changes and lost some weight. Please continue with your dietary modifications, avoid sugary drinks, and try to incorporate regular physical activity. Consider exploring options like the [employer] or [employer], and home exercise options such as [employer] videos for seniors.  -TOBACCO USE DISORDER: Tobacco use disorder means you are dependent on smoking. Although you are not ready to quit, it is important for your health to consider smoking cessation in the future.  -HIDRADENITIS SUPPURATIVA: Hidradenitis suppurativa is a skin condition that causes small, painful lumps under the skin. You have occasional flare-ups and take Bactrim  to manage them. Please take Bactrim  twice daily for 7 days during flare-ups. If you are interested in exploring new treatments, consider seeing a dermatologist.  INSTRUCTIONS:  Please continue monitoring your blood pressure at home and keep taking your medications as  prescribed. Try to incorporate regular physical activity into your routine and continue with your dietary changes. If you experience any new symptoms or have concerns, please schedule a follow-up appointment.

## 2024-05-14 ENCOUNTER — Other Ambulatory Visit: Payer: Self-pay | Admitting: Internal Medicine

## 2024-05-14 DIAGNOSIS — I10 Essential (primary) hypertension: Secondary | ICD-10-CM

## 2024-05-14 DIAGNOSIS — L732 Hidradenitis suppurativa: Secondary | ICD-10-CM

## 2024-05-15 ENCOUNTER — Other Ambulatory Visit (HOSPITAL_COMMUNITY): Payer: Self-pay

## 2024-05-15 MED ORDER — AMLODIPINE BESYLATE 10 MG PO TABS
10.0000 mg | ORAL_TABLET | Freq: Every day | ORAL | 1 refills | Status: AC
  Filled 2024-05-15: qty 90, 90d supply, fill #0
  Filled 2024-08-13: qty 90, 90d supply, fill #1

## 2024-05-16 ENCOUNTER — Other Ambulatory Visit (HOSPITAL_COMMUNITY): Payer: Self-pay

## 2024-05-16 MED ORDER — SULFAMETHOXAZOLE-TRIMETHOPRIM 800-160 MG PO TABS
1.0000 | ORAL_TABLET | Freq: Two times a day (BID) | ORAL | 1 refills | Status: DC
  Filled 2024-05-16: qty 14, 7d supply, fill #0
  Filled 2024-06-11: qty 14, 7d supply, fill #1

## 2024-06-11 ENCOUNTER — Other Ambulatory Visit (HOSPITAL_COMMUNITY): Payer: Self-pay

## 2024-06-11 ENCOUNTER — Encounter: Payer: Self-pay | Admitting: Podiatry

## 2024-06-11 ENCOUNTER — Other Ambulatory Visit: Payer: Self-pay

## 2024-06-11 ENCOUNTER — Ambulatory Visit (INDEPENDENT_AMBULATORY_CARE_PROVIDER_SITE_OTHER): Payer: Self-pay | Admitting: Podiatry

## 2024-06-11 ENCOUNTER — Other Ambulatory Visit: Payer: Self-pay | Admitting: Internal Medicine

## 2024-06-11 DIAGNOSIS — I6330 Cerebral infarction due to thrombosis of unspecified cerebral artery: Secondary | ICD-10-CM | POA: Diagnosis not present

## 2024-06-11 DIAGNOSIS — M79675 Pain in left toe(s): Secondary | ICD-10-CM

## 2024-06-11 DIAGNOSIS — B351 Tinea unguium: Secondary | ICD-10-CM

## 2024-06-11 DIAGNOSIS — M79674 Pain in right toe(s): Secondary | ICD-10-CM

## 2024-06-11 DIAGNOSIS — E7800 Pure hypercholesterolemia, unspecified: Secondary | ICD-10-CM

## 2024-06-11 MED ORDER — ATORVASTATIN CALCIUM 80 MG PO TABS
80.0000 mg | ORAL_TABLET | Freq: Every day | ORAL | 0 refills | Status: DC
  Filled 2024-06-11: qty 90, 90d supply, fill #0

## 2024-06-11 NOTE — Progress Notes (Signed)

## 2024-06-19 ENCOUNTER — Ambulatory Visit: Payer: 59

## 2024-07-24 ENCOUNTER — Ambulatory Visit: Attending: Internal Medicine | Admitting: Internal Medicine

## 2024-07-24 ENCOUNTER — Encounter: Payer: Self-pay | Admitting: Internal Medicine

## 2024-07-24 ENCOUNTER — Other Ambulatory Visit: Payer: Self-pay

## 2024-07-24 VITALS — BP 110/75 | HR 80 | Ht 64.0 in | Wt 228.0 lb

## 2024-07-24 DIAGNOSIS — I10 Essential (primary) hypertension: Secondary | ICD-10-CM

## 2024-07-24 DIAGNOSIS — Z8673 Personal history of transient ischemic attack (TIA), and cerebral infarction without residual deficits: Secondary | ICD-10-CM

## 2024-07-24 DIAGNOSIS — L732 Hidradenitis suppurativa: Secondary | ICD-10-CM

## 2024-07-24 DIAGNOSIS — R7303 Prediabetes: Secondary | ICD-10-CM

## 2024-07-24 DIAGNOSIS — Z6839 Body mass index (BMI) 39.0-39.9, adult: Secondary | ICD-10-CM

## 2024-07-24 DIAGNOSIS — F17200 Nicotine dependence, unspecified, uncomplicated: Secondary | ICD-10-CM

## 2024-07-24 MED ORDER — SULFAMETHOXAZOLE-TRIMETHOPRIM 800-160 MG PO TABS
1.0000 | ORAL_TABLET | Freq: Two times a day (BID) | ORAL | 1 refills | Status: AC
  Filled 2024-07-24 – 2024-09-17 (×3): qty 14, 7d supply, fill #0
  Filled 2024-09-17: qty 14, 7d supply, fill #1

## 2024-07-24 MED ORDER — ATORVASTATIN CALCIUM 80 MG PO TABS
80.0000 mg | ORAL_TABLET | Freq: Every day | ORAL | 1 refills | Status: AC
  Filled 2024-07-24 – 2024-09-17 (×3): qty 90, 90d supply, fill #0

## 2024-07-24 NOTE — Patient Instructions (Addendum)
  VISIT SUMMARY: Today, you came in for your four-month follow-up visit. We reviewed your conditions including prediabetes, hypertension, hidradenitis suppurativa, and your history of stroke. We discussed your current medications, recent health changes, and lifestyle habits. Your blood pressure and cholesterol levels are well-managed with your current medications. We also talked about your weight, diet, and physical activity levels. You received your flu shot and we ordered your annual lab work.  YOUR PLAN: -ESSENTIAL HYPERTENSION: Your blood pressure is well-controlled with your current medication, amlodipine  10 mg daily. Please continue taking this medication and try to limit your salt intake to help maintain your blood pressure levels.  -MORBID OBESITY DUE TO EXCESS CALORIES: Your weight has increased slightly to 228 lbs. We encourage you to continue eating healthy and controlling your portion sizes. Try to increase your physical activity as much as possible, even with your caregiving responsibilities.  -PREDIABETES: Your blood sugar levels are currently stable with an A1c of 6.1%. Continue with your healthy eating habits and portion control to manage your blood sugar levels.  -PURE HYPERCHOLESTEROLEMIA: Your cholesterol levels are being managed with atorvastatin  80 mg daily. Please continue taking this medication. We have ordered annual labs to monitor your cholesterol, kidney, and liver function.  -HIDRADENITIS SUPPURATIVA: You have not had any major flares recently. Continue using Bactrim  as needed for flares. We have sent a refill for Bactrim .  -HISTORY OF STROKE: You are managing your stroke history with atorvastatin  and baby aspirin . Please continue taking these medications as prescribed.  -GENERAL HEALTH MAINTENANCE: You received your flu shot today. We have also ordered your annual lab work to check your cholesterol, kidney, and liver function.  INSTRUCTIONS: Please continue with your  current medications and lifestyle habits as discussed. Make sure to get your annual lab work done to monitor your cholesterol, kidney, and liver function. Follow up with us  in four months for your next visit.                      Contains text generated by Abridge.                                 Contains text generated by Abridge.                          Contains text generated by Abridge.                                 Contains text generated by Abridge.

## 2024-07-24 NOTE — Progress Notes (Signed)
 Patient ID: Sheila Webster, female    DOB: 01-03-1956  MRN: [id number]  CC: Hypertension (HTN f/u. Thompson Dell Wyn received flu vax)   Subjective: Sheila Webster is a 68 y.o. female who presents for chronic ds management. Her concerns today include:  Patient with history of CVA (LT mid corona radiata infarct), tobacco dependence, HTN, hidradenitis   Discussed the use of AI scribe software for clinical note transcription with the patient, who gave verbal consent to proceed.  History of Present Illness   Sheila Webster is a 68 year old female with prediabetes, hypertension, and hidradenitis who presents for a four-month follow-up visit.  HTN: She takes amlodipine  10 mg daily for hypertension and her blood pressure readings are typically at or below 130/80 mmHg. She checks her blood pressure every other day. No chest pain, shortness of breath, or leg swelling.  She takes atorvastatin  80 mg daily and baby aspirin , and has a history of stroke.  She reports no major flares of her hidradenitis suppurativa. She reports using Bactrim  when she has a flare and has a few pills left. She reports she no longer uses mupirocin  ointment.  Obesity/PreDM: Her weight has increased from 223 to 228 pounds since the last visit. She maintains a diet of salads, diet sodas, and occasional fried foods, avoiding sugary drinks. Physical activity is limited to walking at home going up and down 10 steps several times and to the mailbox, as she primarily stays home to care for her mother. Her A1c is 6.1% and blood sugar is 92 mg/dL today. Wanting to try a GLP1 agonist.  She continues to smoke cigarettes and is not ready to quit at this time.   She has received her flu shot already.      Patient Active Problem List   Diagnosis Date Noted   Class 2 severe obesity due to excess calories with serious comorbidity and body mass index (BMI) of 39.0 to 39.9 in adult 09/23/2022   Hidradenitis suppurativa  04/13/2019   Obesity (BMI 35.0-39.9 without comorbidity) 04/12/2019   Tobacco dependence 04/12/2019   History of CVA (cerebrovascular accident) without residual deficits 04/12/2019   Pure hypercholesterolemia    CVA (cerebral vascular accident) (HCC) 02/17/2019   Essential hypertension    Tobacco use      Current Outpatient Medications on File Prior to Visit  Medication Sig Dispense Refill   amLODipine  (NORVASC ) 10 MG tablet Take 1 tablet (10 mg total) by mouth daily. 90 tablet 1   APPLE CIDER VINEGAR PO Take 450 mg by mouth 3 (three) times daily.     aspirin  81 MG chewable tablet Chew 1 tablet (81 mg) by mouth daily. 30 tablet 11   No current facility-administered medications on file prior to visit.    No Known Allergies  Social History   Socioeconomic History   Marital status: Single    Spouse name: Not on file   Number of children: Not on file   Years of education: Not on file   Highest education level: Not on file  Occupational History   Occupation: Unemployed  Tobacco Use   Smoking status: Every Day    Current packs/day: 0.25    Types: Cigarettes   Smokeless tobacco: Never  Vaping Use   Vaping status: Never Used  Substance and Sexual Activity   Alcohol use: No   Drug use: No   Sexual activity: Yes    Birth control/protection: Post-menopausal  Other Topics Concern   Not on  file  Social History Narrative   Not on file   Social Drivers of Health   Financial Resource Strain: Low Risk  (06/14/2023)   Overall Financial Resource Strain (CARDIA)    Difficulty of Paying Living Expenses: Not hard at all  Food Insecurity: No Food Insecurity (06/14/2023)   Hunger Vital Sign    Worried About Running Out of Food in the Last Year: Never true    Ran Out of Food in the Last Year: Never true  Transportation Needs: No Transportation Needs (06/14/2023)   PRAPARE - Administrator, Civil Service (Medical): No    Lack of Transportation (Non-Medical): No  Physical  Activity: Insufficiently Active (06/14/2023)   Exercise Vital Sign    Days of Exercise per Week: 3 days    Minutes of Exercise per Session: 30 min  Stress: No Stress Concern Present (06/14/2023)   [employer] of Occupational Health - Occupational Stress Questionnaire    Feeling of Stress : Not at all  Social Connections: Socially Integrated (06/14/2023)   Social Connection and Isolation Panel    Frequency of Communication with Friends and Family: More than three times a week    Frequency of Social Gatherings with Friends and Family: Three times a week    Attends Religious Services: More than 4 times per year    Active Member of Clubs or Organizations: Yes    Attends Banker Meetings: More than 4 times per year    Marital Status: Married  Catering Manager Violence: Not At Risk (06/14/2023)   Humiliation, Afraid, Rape, and Kick questionnaire    Fear of Current or Ex-Partner: No    Emotionally Abused: No    Physically Abused: No    Sexually Abused: No    Family History  Problem Relation Age of Onset   Cancer Mother    Lymphoma Sister     Past Surgical History:  Procedure Laterality Date   BREAST SURGERY     KNEE SURGERY Right     ROS: Review of Systems Negative except as stated above  PHYSICAL EXAM: BP 110/75   Pulse 80   Ht 5' 4 (1.626 m)   Wt 228 lb (103.4 kg)   SpO2 98%   BMI 39.14 kg/m   Wt Readings from Last 3 Encounters:  07/24/24 228 lb (103.4 kg)  03/23/24 223 lb (101.2 kg)  11/22/23 224 lb (101.6 kg)    Physical Exam  General appearance - alert, well appearing, older AAF  and in no distress Mental status - normal mood, behavior, speech, dress, motor activity, and thought processes Chest - clear to auscultation, no wheezes, rales or rhonchi, symmetric air entry Heart - normal rate, regular rhythm, normal S1, S2, no murmurs, rubs, clicks or gallops Extremities - peripheral pulses normal, no pedal edema, no clubbing or cyanosis       Latest Ref Rng & Units 07/25/2023    9:47 AM 09/23/2022    9:18 AM 11/12/2021    9:48 AM  CMP  Glucose 70 - 99 mg/dL 92  84  91   BUN 8 - 27 mg/dL 15  10  11    Creatinine 0.57 - 1.00 mg/dL 9.41  9.37  9.26   Sodium 134 - 144 mmol/L 141  141  139   Potassium 3.5 - 5.2 mmol/L 4.4  4.5  4.3   Chloride 96 - 106 mmol/L 106  104  103   CO2 20 - 29 mmol/L 21  20  23  Calcium  8.7 - 10.3 mg/dL 9.0  9.2  9.0   Total Protein 6.0 - 8.5 g/dL 6.7  7.1  6.7   Total Bilirubin 0.0 - 1.2 mg/dL 0.3  0.3  0.2   Alkaline Phos 44 - 121 IU/L 115  122  110   AST 0 - 40 IU/L 10  13  15    ALT 0 - 32 IU/L 13  14  22     Lipid Panel     Component Value Date/Time   CHOL 130 07/25/2023 0947   TRIG 63 07/25/2023 0947   HDL 42 07/25/2023 0947   CHOLHDL 3.1 07/25/2023 0947   CHOLHDL 4.4 02/18/2019 0332   VLDL 17 02/18/2019 0332   LDLCALC 75 07/25/2023 0947    CBC    Component Value Date/Time   WBC 12.5 (H) 07/25/2023 0947   WBC 9.3 02/18/2019 1123   RBC 4.99 07/25/2023 0947   RBC 4.54 02/18/2019 1123   HGB 13.2 07/25/2023 0947   HCT 40.9 07/25/2023 0947   PLT 371 07/25/2023 0947   MCV 82 07/25/2023 0947   MCH 26.5 (L) 07/25/2023 0947   MCH 25.6 (L) 02/18/2019 1123   MCHC 32.3 07/25/2023 0947   MCHC 30.6 02/18/2019 1123   RDW 13.7 07/25/2023 0947   LYMPHSABS 2.2 07/25/2023 0947   MONOABS 0.7 02/17/2019 1955   EOSABS 0.1 07/25/2023 0947   BASOSABS 0.1 07/25/2023 0947    ASSESSMENT AND PLAN: 1. Essential hypertension (Primary) At goal.  Continue amlodipine  10 mg daily. - Comprehensive metabolic panel with GFR - CBC  2. Morbid obesity (HCC) Obesity associated with HTN and hx of CVA Discussed on continue to encourage healthy eating habits and to be mindful of portion sizes especially with the upcoming holidays.  Encouraged her to move as much as she can and try to fit it into her daily routine. Her BMI stands at 39 so does not meet the criteria as of now for insurance coverage. - Lipid  panel  3. Prediabetes See #2 above  4. History of CVA in adulthood Continue ASA and Lipitor  - atorvastatin  (LIPITOR ) 80 MG tablet; Take 1 tablet (80 mg total) by mouth daily at 6 PM.  Dispense: 90 tablet; Refill: 1 - Lipid panel  5. Tobacco dependence Encourage smoking cessation. Pt not ready.  6. Hidradenitis suppurativa RF Bactrim  to keep on hand to use during flares - sulfamethoxazole -trimethoprim  (BACTRIM  DS) 800-160 MG tablet; Take 1 tablet by mouth 2 (two) times daily.  Dispense: 14 tablet; Refill: 1    Patient was given the opportunity to ask questions.  Patient verbalized understanding of the plan and was able to repeat key elements of the plan.   This documentation was completed using Paediatric nurse.  Any transcriptional errors are unintentional.  Orders Placed This Encounter  Procedures   Comprehensive metabolic panel with GFR   CBC   Lipid panel     Requested Prescriptions   Signed Prescriptions Disp Refills   atorvastatin  (LIPITOR ) 80 MG tablet 90 tablet 1    Sig: Take 1 tablet (80 mg total) by mouth daily at 6 PM.   sulfamethoxazole -trimethoprim  (BACTRIM  DS) 800-160 MG tablet 14 tablet 1    Sig: Take 1 tablet by mouth 2 (two) times daily.    Return in about 4 months (around 11/21/2024).  Barnie Louder, MD, FACP

## 2024-07-25 ENCOUNTER — Ambulatory Visit: Payer: Self-pay | Admitting: Internal Medicine

## 2024-07-25 LAB — CBC
Hematocrit: 41.1 % (ref 34.0–46.6)
Hemoglobin: 13 g/dL (ref 11.1–15.9)
MCH: 25.9 pg — ABNORMAL LOW (ref 26.6–33.0)
MCHC: 31.6 g/dL (ref 31.5–35.7)
MCV: 82 fL (ref 79–97)
Platelets: 404 x10E3/uL (ref 150–450)
RBC: 5.02 x10E6/uL (ref 3.77–5.28)
RDW: 13.6 % (ref 11.7–15.4)
WBC: 11.6 x10E3/uL — ABNORMAL HIGH (ref 3.4–10.8)

## 2024-07-25 LAB — COMPREHENSIVE METABOLIC PANEL WITH GFR
ALT: 13 IU/L (ref 0–32)
AST: 12 IU/L (ref 0–40)
Albumin: 4.2 g/dL (ref 3.9–4.9)
Alkaline Phosphatase: 133 IU/L (ref 49–135)
BUN/Creatinine Ratio: 20 (ref 12–28)
BUN: 12 mg/dL (ref 8–27)
Bilirubin Total: 0.3 mg/dL (ref 0.0–1.2)
CO2: 24 mmol/L (ref 20–29)
Calcium: 9.2 mg/dL (ref 8.7–10.3)
Chloride: 104 mmol/L (ref 96–106)
Creatinine, Ser: 0.59 mg/dL (ref 0.57–1.00)
Globulin, Total: 3 g/dL (ref 1.5–4.5)
Glucose: 83 mg/dL (ref 70–99)
Potassium: 4.5 mmol/L (ref 3.5–5.2)
Sodium: 142 mmol/L (ref 134–144)
Total Protein: 7.2 g/dL (ref 6.0–8.5)
eGFR: 98 mL/min/1.73 (ref >59)

## 2024-07-25 LAB — LIPID PANEL
Chol/HDL Ratio: 3 ratio (ref 0.0–4.4)
Cholesterol, Total: 143 mg/dL (ref 100–199)
HDL: 47 mg/dL (ref >39)
LDL Chol Calc (NIH): 82 mg/dL (ref 0–99)
Triglycerides: 71 mg/dL (ref 0–149)
VLDL Cholesterol Cal: 14 mg/dL (ref 5–40)

## 2024-07-30 ENCOUNTER — Other Ambulatory Visit: Payer: Self-pay

## 2024-07-30 ENCOUNTER — Other Ambulatory Visit (HOSPITAL_COMMUNITY): Payer: Self-pay

## 2024-07-30 ENCOUNTER — Other Ambulatory Visit: Payer: Self-pay | Admitting: Internal Medicine

## 2024-07-30 MED ORDER — EZETIMIBE 10 MG PO TABS
10.0000 mg | ORAL_TABLET | Freq: Every day | ORAL | 3 refills | Status: AC
  Filled 2024-07-30 (×2): qty 90, 90d supply, fill #0

## 2024-08-13 ENCOUNTER — Other Ambulatory Visit: Payer: Self-pay

## 2024-09-17 ENCOUNTER — Other Ambulatory Visit: Payer: Self-pay

## 2024-09-17 ENCOUNTER — Other Ambulatory Visit (HOSPITAL_COMMUNITY): Payer: Self-pay

## 2024-10-02 ENCOUNTER — Ambulatory Visit

## 2024-10-12 ENCOUNTER — Ambulatory Visit: Admitting: Podiatry

## 2024-11-22 ENCOUNTER — Ambulatory Visit: Admitting: Internal Medicine

## 2024-12-11 ENCOUNTER — Ambulatory Visit
